# Patient Record
Sex: Female | Born: 1975 | Race: Black or African American | Hispanic: No | Marital: Single | Smoking: Former smoker
Health system: Southern US, Community
[De-identification: ages and names within clinical notes are randomized; demographics above are authoritative.]

## PROBLEM LIST (undated history)

## (undated) DIAGNOSIS — F32A Depression, unspecified: Secondary | ICD-10-CM

## (undated) DIAGNOSIS — F319 Bipolar disorder, unspecified: Secondary | ICD-10-CM

## (undated) DIAGNOSIS — F329 Major depressive disorder, single episode, unspecified: Secondary | ICD-10-CM

## (undated) DIAGNOSIS — Z8719 Personal history of other diseases of the digestive system: Secondary | ICD-10-CM

## (undated) DIAGNOSIS — K219 Gastro-esophageal reflux disease without esophagitis: Secondary | ICD-10-CM

## (undated) DIAGNOSIS — J45909 Unspecified asthma, uncomplicated: Secondary | ICD-10-CM

## (undated) DIAGNOSIS — R251 Tremor, unspecified: Secondary | ICD-10-CM

## (undated) HISTORY — DX: Gastro-esophageal reflux disease without esophagitis: K21.9

## (undated) HISTORY — PX: NO PAST SURGERIES: SHX2092

## (undated) HISTORY — DX: Unspecified asthma, uncomplicated: J45.909

---

## 2005-05-04 ENCOUNTER — Inpatient Hospital Stay: Payer: Self-pay

## 2005-09-28 ENCOUNTER — Ambulatory Visit: Payer: Self-pay | Admitting: Family Medicine

## 2005-10-24 ENCOUNTER — Ambulatory Visit: Payer: Self-pay | Admitting: Family Medicine

## 2006-05-11 ENCOUNTER — Ambulatory Visit: Payer: Self-pay | Admitting: Family Medicine

## 2006-10-31 ENCOUNTER — Encounter: Payer: Self-pay | Admitting: Family Medicine

## 2006-10-31 DIAGNOSIS — F411 Generalized anxiety disorder: Secondary | ICD-10-CM | POA: Insufficient documentation

## 2006-10-31 DIAGNOSIS — F329 Major depressive disorder, single episode, unspecified: Secondary | ICD-10-CM

## 2006-10-31 DIAGNOSIS — F3289 Other specified depressive episodes: Secondary | ICD-10-CM | POA: Insufficient documentation

## 2008-03-11 ENCOUNTER — Ambulatory Visit: Payer: Self-pay | Admitting: *Deleted

## 2008-03-11 ENCOUNTER — Emergency Department (HOSPITAL_COMMUNITY): Admission: EM | Admit: 2008-03-11 | Discharge: 2008-03-12 | Payer: Self-pay | Admitting: Emergency Medicine

## 2008-03-12 ENCOUNTER — Inpatient Hospital Stay (HOSPITAL_COMMUNITY): Admission: RE | Admit: 2008-03-12 | Discharge: 2008-03-13 | Payer: Self-pay | Admitting: *Deleted

## 2008-10-06 ENCOUNTER — Ambulatory Visit: Payer: Self-pay | Admitting: Unknown Physician Specialty

## 2009-07-13 ENCOUNTER — Ambulatory Visit (HOSPITAL_COMMUNITY): Admission: RE | Admit: 2009-07-13 | Discharge: 2009-07-13 | Payer: Self-pay | Admitting: Psychiatry

## 2009-07-20 ENCOUNTER — Encounter (INDEPENDENT_AMBULATORY_CARE_PROVIDER_SITE_OTHER): Payer: Self-pay | Admitting: Internal Medicine

## 2009-07-20 ENCOUNTER — Other Ambulatory Visit (HOSPITAL_COMMUNITY): Admission: RE | Admit: 2009-07-20 | Discharge: 2009-08-04 | Payer: Self-pay | Admitting: Psychiatry

## 2009-07-22 ENCOUNTER — Ambulatory Visit: Payer: Self-pay | Admitting: Psychiatry

## 2010-07-26 NOTE — Discharge Summary (Signed)
Meredith Cochran, Meredith Cochran                 ACCOUNT NO.:  [id number]   MEDICAL RECORD NO.:  [id number]          PATIENT TYPE:  IPS   LOCATION:  0306                          FACILITY:  [facility]   PHYSICIAN:  Jasmine Pang, M.D. DATE OF BIRTH:  November 18, 1975   DATE OF ADMISSION:  03/12/2008  DATE OF DISCHARGE:  03/13/2008                               DISCHARGE SUMMARY   IDENTIFICATION:  This is a 35 year old African American female who was  admitted on a voluntary basis on March 11, 2008.   HISTORY OF PRESENT ILLNESS:  The patient presents with a history of  overdosing on Klonopin.  It was her own medication.  She was having some  conflict with her boyfriend, who she has been a relationship for 4  years.  She states she was in a bad mood after the conversation.  She  also had a blow up with her boss reporting that just little things make  her mad.  She began to take more of her Klonopin, talked to her friend  and her boyfriend about taking Klonopin.  She then called her therapist  who advised her to go to the emergency room to get some help.  She was  having some suicidal thoughts.  She reports sleeping satisfactorily at  home.  Also, her appetite has been satisfactory.   PAST PSYCHIATRIC HISTORY:  This is the first admission to the [facility].  She has had no other hospitalizations.  She sees Dr.  Milagros Evener, she has been seeing her since 2006.  She is treating her  for anxiety and depression.  The patient has a therapist named, Vernell Leep.  In the past, the patient has been on Lexapro, Xanax, and  Paxil.  Paxil was causing excessive yawning.   FAMILY HISTORY:  Sister and mother both have depression.   ALCOHOL AND DRUG HISTORY:  The patient states that she drinks on  occasion.  Urine drug screen was positive for THC.  She denies any other  drug use.   MEDICAL PROBLEMS:  Asthma.   MEDICATIONS:  She has been on Pristiq 100 mg daily, Wellbutrin 300 mg  daily, Klonopin 1  mg q.i.d. p.r.n.   DRUG ALLERGIES:  No known drug allergies.   PHYSICAL EXAM:  There were no acute physical or medical problems.   LABORATORY DATA:  Shows a CBC within normal limits.  Urine drug screen  was positive for benzodiazepines, positive for THC.  Urine pregnancy  test was negative.  Alcohol level was less than 5.  Acetaminophen level  less than 10.  Urinalysis was negative.   HOSPITAL COURSE:  Upon admission, the patient was placed on Pristiq 100  mg p.o. q.day, Wellbutrin XL 300 mg q.day, and an albuterol inhaler 2  puffs q.4 h. p.r.n. shortness of breath.  She was also restarted on her  Klonopin 1 mg p.o. q.i.d. p.r.n. and Abilify 5 mg p.o. q.h.s.  In  individual sessions with me, the patient was friendly and cooperative.  She states she has been having problems with her boyfriend, who was the  father of her child when she got to work.  On the day of admission, she  felt very bad (anxious and depressed).  She has been having anger  problems.  Her friend got her to the ED and they transferred to [facility].  On March 13, 2008, sleep was good.  Appetite  was good.  Mood was less depressed, less anxious.  Affect was consistent  with mood.  There was no suicidal or homicidal ideation.  No thoughts of  self-injurious behavior.  No auditory or visual hallucinations.  No  paranoia or delusions.  Thoughts were logical and goal-directed.  Thought content no predominant theme.  Cognitive was grossly intact.  Insight good.  Judgment good.  Impulse control good.  The patient wanted  to go home and was felt to be safe for discharge.  Her boyfriend was  going to pick her up today.  She felt she would tolerate the Abilify  well and felt that it already was helping her some.   DISCHARGE DIAGNOSES:  Axis I:  Bipolar disorder, not otherwise  specified, tetrahydrocannabinol abuse.  Axis II:  None.  Axis III:  Asthma.  Axis IV:  Problems with her boyfriend and other  psychosocial problems.  Axis V:  Global assessment of functioning was 50 on discharge.  GAF was  35 to 40 upon admission.  GAF highest past year was 60.   DISCHARGE PLANS:  There was no specific activity level or dietary  restrictions.   POSTHOSPITAL CARE PLANS:  The patient will see Dr. Evelene Croon on January 30th  at 4 p.m.  She will see Vernell Leep, her counselor for therapy.   DISCHARGE MEDICATIONS:  1. Wellbutrin XL 300 mg daily.  2. Abilify 5 mg at bedtime.  3. Pristiq 100 mg daily.  4. Klonopin 1 mg up to four times a day as needed.      Jasmine Pang, M.D.  Electronically Signed     BHS/MEDQ  D:  03/13/2008  T:  03/14/2008  Job:  [id number]

## 2010-07-26 NOTE — H&P (Signed)
NAMEVIVAN, Meredith Cochran                 ACCOUNT NO.:  [id number]   MEDICAL RECORD NO.:  [id number]          PATIENT TYPE:  IPS   LOCATION:  0306                          FACILITY:  [facility]   PHYSICIAN:  Jasmine Pang, M.D. DATE OF BIRTH:  1975-09-07   DATE OF ADMISSION:  03/12/2008  DATE OF DISCHARGE:                       PSYCHIATRIC ADMISSION ASSESSMENT   I am dictating on a 35 year old female voluntarily March 11, 2008.   HISTORY OF PRESENT ILLNESS:  Patient presents with a history of  overdosing on Klonopin.  It was her own medication.  She was having some  conflict with her boyfriend who she has been in a relationship for 4  years.  States she was in a bad mood after the conversation.  Also, had  had a blow up with her boss reporting that just little things make her  mad.  She began to take more of her Klonopin, texted her friend and her  boyfriend about taking the Klonopin, had then called her therapist who  advised her to go to the emergency room to get some help.  She was  having suicidal thoughts.  She reports sleeping satisfactorily at home.  Also, her appetite has been satisfactory.   PAST PSYCHIATRIC HISTORY:  First admission to the [facility].  No other hospitalizations.  Sees Dr. Milagros Evener, has been  seeing her since 2006 who is treating her anxiety and depression.  Patient has a therapist named Lowella Fairy.  In the past, patient has been  on Lexapro and Xanax and Paxil, Paxil caused excessive yawning.   SOCIAL HISTORY:  A 35 year old female.  She has a son.  She works at  [employer] and has been for the past 5 years and is currently in a  relationship.   FAMILY HISTORY:  Sister and mother both with depression.   ALCOHOL AND DRUG HISTORY:  Patient does state she drinks on occasion.  Urine drug screen is positive for THC.  Denies any other drug use.   PRIMARY CARE Marge Vandermeulen:  Unknown.   MEDICAL PROBLEMS:  Asthma.   MEDICATIONS:  Has been on:  1.  Pristiq 100 mg daily.  2. Wellbutrin 300 mg daily.  3. Klonopin 1 mg q.i.d. p.r.n.   DRUG ALLERGIES:  NO KNOWN ALLERGIES.   PHYSICAL EXAM:  This is an overweight female fully assessed at [facility] Emergency Department.  Her physical exam was reviewed.  She  received no medications.  Her temperature is 97.4, 66 heart rate, 60  respirations, blood pressure is 114/64, 5 feet 7 inches tall, 247  pounds.   LABORATORY DATA:  Shows a CBC within normal limits.  Urine drug screen  is positive for benzodiazepines, positive for THC.  Urine pregnancy test  is negative.  Alcohol level less than 5.  Acetaminophen level less than  10.  Urinalysis is negative.   MENTAL STATUS EXAM:  She is fully alert, cooperative, somewhat  disheveled, good eye contact.  Speech is clear.  Although articulate,  she does ramble at times.  Her mood is neutral.  Patient's affect is  pleasant.  Some anxiety.  Her thought processes are coherent.  No  evidence of psychosis.  Denies any suicidal thoughts at this time.  Cognitive function intact.  Her memory is good.  Judgment and insight  appear to be good.   AXIS I:  1. Mood disorder.  2. THC abuse.  AXIS II:  Deferred.  AXIS III:  Asthma.  AXIS IV:  Problems with her boyfriend and other psychosocial problems.  AXIS V:  Current is 35 to 40.   PLAN:  Is to stabilize her mood and thinking.  We will clarify her  medications.  Patient gave permission to call Dr. Milagros Evener.  A  family session was offered with her support group.  We will continue  with her Pristiq and Wellbutrin and Klonopin after clarification,  reinforce medication compliance, and patient is to continue with her  therapy sessions.   TENTATIVE LENGTH OF STAY:  At this time, is 2 to 3 days.      Landry Corporal, N.P.      Jasmine Pang, M.D.  Electronically Signed    JO/MEDQ  D:  03/12/2008  T:  03/12/2008  Job:  [id number]

## 2010-07-29 NOTE — Assessment & Plan Note (Signed)
[facility]                             [facility] OFFICE NOTE   NAME:Pavlik, COREEN                          MRN:          [id number]  DATE:09/28/2005                            DOB:          26-May-1975    A 35 year old black female, new to the practice, here to establish.  Complains of dizzy spells lately.  Has had anxiety which has increased  lately.  Diagnosed with moderate to severe anxiety and depression, now  having issues again.  She has been on Klonopin, Xanax and Paxil in the past.  Felt that she had side-effect of yawning from one of those, presumably  Paxil.  Also has facial hair, and requests Vaniqa.  She has significant  problems with talking on the phone.  Her heart starts pounding, hands will  shake, she gets dizziness, shortness of breath, and the smallest thing makes  her angry.   PAST MEDICAL HISTORY:  Is breast-feeding at the current time, has had 2  vaginal deliveries.  Goes to OB/GYN at [facility].  Denies rheumatic fever or  scarlet fever, high blood pressure, heart disease, pneumonia, diabetes,  liver disease or thyroid disease.  Has had asthma, uses albuterol rarely,  maybe 2 or 3 times a year, mostly in the winter.   ALLERGIES:  NO KNOWN DRUG ALLERGIES.   Does not know her cholesterol.  Smokes 5 or 6 cigarettes a day, has done so  during her pregnancy, since 35 years of age.  Quit in 2006 for a year, but  is back smoking.  Drinks occasionally.  Does not use drugs.   MEDICATIONS:  Allegra 180 mg a day, and Flonase as needed, neither of which  she takes regularly.  She has also been diagnosed yesterday with a dental  abscess, is on penicillin and Vicodin.   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  Is single.  Has a  significant other, with whom she has been monogamous for the last 2 years.  She has 2 children; a female 21 years of age, from a different father; and a  female 46 months of age, with her current  significant other.   FAMILY HISTORY:  Father is alive at age of 16, mother is alive at age of 1.  One brother at 21 and one sister at 56.  Paternal grandmother, uncle and  aunt all have high blood pressure.  Paternal uncle, grandmother, aunt and  maternal grandmother all had diabetes.  Paternal uncle has had prostate  cancer.  Paternal aunt x2 has had breast cancer.  No other cancer, alcohol  or drug abuse, or strokes in the family.   Unknown when her last tetanus shot was.   PHYSICAL EXAMINATION:  This is a well-developed, well-nourished, 35 year old  black female, mildly obese, in no acute distress.  Temperature of 98.0, pulse of 80, blood pressure of 120/60, a weight of 210,  height of 67 inches.  HEENT:  Within normal limits.  NECK:  Without adenopathy.  Thyroid is without nodularity.  LUNGS:  Clear to auscultation.  BACK:  Straight, nontender, with no CVA tenderness.  HEART:  Regular rate and rhythm without murmur.  PSYCHOLOGIC:  Good affect, good insight and judgment is normal.   ASSESSMENT:  Anxiety, possibly depression, in patient who is breast-feeding.   PLAN:  We discussed the tradeoff of treating the anxiety and stopping her  breast-feeding, versus breast-feeding and dealing with her anxiety with  conservative measures, leave time for herself, be off work for 1 month via  [employer].  An excuse was written, she will need to bring in the paperwork.  Exercise regularly, talk with people as much as possible.  If it does not  work, we will put her on Zoloft, but she will need to stop breast-feeding.  The risks of Xanax and SSRIs with breast-feeding were discussed.  We will  give her a tetanus shot in the future.  Return here as needed.                                   Arta Silence, MD   RNS/MedQ  DD:  09/29/2005  DT:  09/30/2005  Job #:  [id number]

## 2010-12-16 LAB — URINALYSIS, ROUTINE W REFLEX MICROSCOPIC
Hgb urine dipstick: NEGATIVE
Nitrite: NEGATIVE
Protein, ur: NEGATIVE mg/dL
Specific Gravity, Urine: 1.01 (ref 1.005–1.030)
Urobilinogen, UA: 1 mg/dL (ref 0.0–1.0)

## 2010-12-16 LAB — ETHANOL: Alcohol, Ethyl (B): <5 mg/dL (ref 0–10)

## 2010-12-16 LAB — RAPID URINE DRUG SCREEN, HOSP PERFORMED
Amphetamines: NOT DETECTED
Barbiturates: NOT DETECTED
Benzodiazepines: POSITIVE — AB
Tetrahydrocannabinol: POSITIVE — AB

## 2010-12-16 LAB — DIFFERENTIAL
Lymphocytes Relative: 26 % (ref 12–46)
Monocytes Absolute: 0.5 10*3/uL (ref 0.1–1.0)
Monocytes Relative: 5 % (ref 3–12)
Neutro Abs: 6 10*3/uL (ref 1.7–7.7)

## 2010-12-16 LAB — CBC
Hemoglobin: 12.7 g/dL (ref 12.0–15.0)
RBC: 4.32 MIL/uL (ref 3.87–5.11)

## 2010-12-16 LAB — POCT PREGNANCY, URINE: Preg Test, Ur: NEGATIVE

## 2013-12-04 ENCOUNTER — Telehealth (INDEPENDENT_AMBULATORY_CARE_PROVIDER_SITE_OTHER): Payer: Self-pay

## 2013-12-04 DIAGNOSIS — F4323 Adjustment disorder with mixed anxiety and depressed mood: Secondary | ICD-10-CM

## 2013-12-04 NOTE — Telephone Encounter (Signed)
Pt seen in office today by Dr Andrey Campanile. Initial appt for sleeve and orders placed in epic. Nutrition and Psch consults were placed thru allscripts.

## 2013-12-09 ENCOUNTER — Ambulatory Visit (HOSPITAL_COMMUNITY): Payer: Self-pay

## 2013-12-09 ENCOUNTER — Ambulatory Visit (HOSPITAL_COMMUNITY): Admission: RE | Admit: 2013-12-09 | Payer: Self-pay | Source: Ambulatory Visit

## 2013-12-10 ENCOUNTER — Ambulatory Visit (HOSPITAL_COMMUNITY)
Admission: RE | Admit: 2013-12-10 | Discharge: 2013-12-10 | Disposition: A | Discharge status: Home / Self Care | Payer: BC Managed Care – PPO | Source: Ambulatory Visit | Attending: General Surgery | Admitting: General Surgery

## 2013-12-10 DIAGNOSIS — F4323 Adjustment disorder with mixed anxiety and depressed mood: Secondary | ICD-10-CM | POA: Diagnosis not present

## 2013-12-10 DIAGNOSIS — K449 Diaphragmatic hernia without obstruction or gangrene: Secondary | ICD-10-CM | POA: Diagnosis not present

## 2013-12-10 DIAGNOSIS — K219 Gastro-esophageal reflux disease without esophagitis: Secondary | ICD-10-CM | POA: Diagnosis not present

## 2013-12-10 DIAGNOSIS — Z01818 Encounter for other preprocedural examination: Secondary | ICD-10-CM | POA: Diagnosis not present

## 2014-01-06 ENCOUNTER — Encounter: Payer: BC Managed Care – PPO | Attending: General Surgery | Admitting: Dietician

## 2014-01-06 ENCOUNTER — Encounter: Payer: Self-pay | Admitting: Dietician

## 2014-01-06 ENCOUNTER — Other Ambulatory Visit (INDEPENDENT_AMBULATORY_CARE_PROVIDER_SITE_OTHER): Payer: Self-pay

## 2014-01-06 DIAGNOSIS — Z713 Dietary counseling and surveillance: Secondary | ICD-10-CM | POA: Insufficient documentation

## 2014-01-06 DIAGNOSIS — Z6841 Body Mass Index (BMI) 40.0 and over, adult: Secondary | ICD-10-CM | POA: Diagnosis not present

## 2014-01-06 NOTE — Patient Instructions (Signed)
Follow Pre-Op Goals Try Protein Shakes Call [facility] at [phone] when surgery is scheduled to enroll in Pre-Op Class 

## 2014-01-06 NOTE — Progress Notes (Signed)
  Pre-Op Assessment Visit:  Pre-Operative Sleeve Gastrectomy Surgery  Medical Nutrition Therapy:  Appt start time: 0830   End time:  0915.  Patient was seen on 01/06/2014 for Pre-Operative Sleeve Gastrectomy Nutrition Assessment. [facility] on 01/06/2014.   Preferred Learning Style:   No preference indicated   Learning Readiness:   Ready  Handouts given during visit include:  Pre-Op Goals Bariatric Surgery Protein Shakes  Teaching Method Utilized:  Visual Auditory Hands on  Barriers to learning/adherence to lifestyle change: none  Demonstrated degree of understanding via:  Teach Back   Patient to call the [facility] to enroll in Pre-Op and Post-Op Nutrition Education when surgery date is scheduled.

## 2014-01-21 ENCOUNTER — Encounter: Payer: BC Managed Care – PPO | Attending: General Surgery | Admitting: Dietician

## 2014-01-21 VITALS — Wt 295.0 lb

## 2014-01-21 DIAGNOSIS — Z6841 Body Mass Index (BMI) 40.0 and over, adult: Secondary | ICD-10-CM | POA: Diagnosis not present

## 2014-01-21 DIAGNOSIS — E669 Obesity, unspecified: Secondary | ICD-10-CM

## 2014-01-21 DIAGNOSIS — Z713 Dietary counseling and surveillance: Secondary | ICD-10-CM | POA: Diagnosis not present

## 2014-01-21 NOTE — Progress Notes (Signed)
  Supervised weight loss:  Appt start time: 0950 end time:  1005.  SWL visit #1:  Primary concerns today:  Meredith Cochran returns today for her 1st SWL visit out of 3 in preparation for sleeve gastrectomy. She has lost 4 pounds since her assessment a few weeks ago. She reports cutting out coffee, as she used a lot of sweetened creamer. Also tried protein shakes and likes Premier. She has been working on cutting out refined carbs altogether. Working on Passenger transport managerbuying healthy foods when grocery shopping.  Has also been looking at Bariatric Eating website and finding recipes. Working on chewing food thoroughly.  Wt Readings from Last 3 Encounters:  01/21/14 295 lb (133.811 kg)  01/06/14 299 lb 1.6 oz (135.671 kg)   Ht Readings from Last 3 Encounters:  01/06/14 5\' 8"  (1.727 m)  09/20/05 5\' 7"  (1.702 m)   Body mass index is 44.86 kg/(m^2). @BMIFA @ Normalized weight-for-age data available only for age 24 to 20 years. Normalized stature-for-age data available only for age 24 to 20 years.   MEDICATIONS: see list  DIETARY INTAKE:  Having boiled eggs for breakfast, 2 protein shakes per day.   Estimated energy needs: 1600-1800 calories  Progress Towards Goal(s):  In progress.   Nutritional Diagnosis:  [place]-3.3 Overweight/obesity related to past poor dietary habits and physical inactivity as evidenced by patient in SWL for pending gastric sleeve surgery following dietary guidelines for continued weight loss.     Intervention:  Nutrition counseling provided.   Monitoring/Evaluation:  Dietary intake, exercise, and body weight in 4 week(s).

## 2014-01-21 NOTE — Patient Instructions (Signed)
Continue practicing pre op goals.

## 2014-01-27 ENCOUNTER — Ambulatory Visit: Payer: BC Managed Care – PPO | Admitting: Dietician

## 2014-02-10 ENCOUNTER — Ambulatory Visit: Payer: BC Managed Care – PPO | Admitting: Dietician

## 2014-02-18 ENCOUNTER — Encounter: Payer: BC Managed Care – PPO | Attending: General Surgery | Admitting: Dietician

## 2014-02-18 VITALS — Ht 68.0 in | Wt 283.7 lb

## 2014-02-18 DIAGNOSIS — Z6841 Body Mass Index (BMI) 40.0 and over, adult: Secondary | ICD-10-CM | POA: Diagnosis not present

## 2014-02-18 DIAGNOSIS — Z713 Dietary counseling and surveillance: Secondary | ICD-10-CM | POA: Insufficient documentation

## 2014-02-18 DIAGNOSIS — E669 Obesity, unspecified: Secondary | ICD-10-CM

## 2014-02-18 NOTE — Progress Notes (Signed)
  Supervised weight loss:  Appt start time: 0955 end time:  1010.  SWL visit #2:  Primary concerns today:  Safire returns today for her 2nd SWL visit out of 3 in preparation for sleeve gastrectomy. She has lost 12 pounds since last month!  Overall things have been going well. Those has had some carbs (popcorn, chips, pop tarts). Overall having a lot of protein. Having cheese in moderation. Eating vegetables and beans.   Would like to start exercise and is moving this week.   Wt Readings from Last 3 Encounters:  02/18/14 283 lb 11.2 oz (128.685 kg)  01/21/14 295 lb (133.811 kg)  01/06/14 299 lb 1.6 oz (135.671 kg)   Ht Readings from Last 3 Encounters:  02/18/14 5\' 8"  (1.727 m)  01/06/14 5\' 8"  (1.727 m)  09/20/05 5\' 7"  (1.702 m)   Body mass index is 43.15 kg/(m^2). @BMIFA @ Normalized weight-for-age data available only for age 51 to 20 years. Normalized stature-for-age data available only for age 51 to 20 years.   MEDICATIONS: see list  DIETARY INTAKE:  Having boiled eggs for breakfast, 2 protein shakes per day.   Estimated energy needs: 1600-1800 calories  Progress Towards Goal(s):  In progress.   Nutritional Diagnosis:  [place]-3.3 Overweight/obesity related to past poor dietary habits and physical inactivity as evidenced by patient in SWL for pending gastric sleeve surgery following dietary guidelines for continued weight loss.     Intervention:  Nutrition counseling provided. Plan: Continue working on C.H. Robinson WorldwidePre-Op goals. Plan to walk 15-20 minutes 3 x week. Try to get out in the middle of the day.  Work on increasing fluid intake (hot tea or water).   Monitoring/Evaluation:  Dietary intake, exercise, and body weight in 4 week(s).

## 2014-02-18 NOTE — Patient Instructions (Addendum)
Continue working on C.H. Robinson WorldwidePre-Op goals. Plan to walk 15-20 minutes 3 x week. Try to get out in the middle of the day.  Work on increasing fluid intake (hot tea or water).

## 2014-03-17 ENCOUNTER — Ambulatory Visit: Payer: BC Managed Care – PPO | Admitting: Dietician

## 2014-03-19 ENCOUNTER — Encounter: Payer: 59 | Attending: General Surgery | Admitting: Dietician

## 2014-03-19 DIAGNOSIS — Z713 Dietary counseling and surveillance: Secondary | ICD-10-CM | POA: Diagnosis not present

## 2014-03-19 DIAGNOSIS — Z6841 Body Mass Index (BMI) 40.0 and over, adult: Secondary | ICD-10-CM | POA: Insufficient documentation

## 2014-03-19 NOTE — Progress Notes (Signed)
  Supervised weight loss:  Appt start time: 830 end time:  845.  SWL visit #3:  Primary concerns today:  Meredith Cochran returns today for her 3rd SWL visit out of 3 in preparation for sleeve gastrectomy. She has lost 6 pounds since last month.   Has been limiting exercise. Starting exercise but stopped while on vacation. Plans to start walking and doing inside exercise. Has been increasing her fluid intake.    Wt Readings from Last 3 Encounters:  03/19/14 277 lb (125.646 kg)  02/18/14 283 lb 11.2 oz (128.685 kg)  01/21/14 295 lb (133.811 kg)   Ht Readings from Last 3 Encounters:  03/19/14 5\' 8"  (1.727 m)  02/18/14 5\' 8"  (1.727 m)  01/06/14 5\' 8"  (1.727 m)   Body mass index is 42.13 kg/(m^2). @BMIFA @ Normalized weight-for-age data available only for age 68 to 20 years. Normalized stature-for-age data available only for age 68 to 20 years.   MEDICATIONS: see list  DIETARY INTAKE:  Having boiled eggs for breakfast, 2 protein shakes per day.   Estimated energy needs: 1600-1800 calories  Progress Towards Goal(s):  In progress.   Nutritional Diagnosis:  [place]-3.3 Overweight/obesity related to past poor dietary habits and physical inactivity as evidenced by patient in SWL for pending gastric sleeve surgery following dietary guidelines for continued weight loss.     Intervention:  Nutrition counseling provided. Plan: Continue working on C.H. Robinson WorldwidePre-Op goals. Plan to walk 15-20 minutes 3 x week. Try to get out in the middle of the day.  Work on increasing fluid intake (hot tea or water).   Monitoring/Evaluation:  Dietary intake, exercise, and body weight. Follow up to attend Pre-Op Class

## 2014-03-19 NOTE — Patient Instructions (Signed)
Continue working on Pre-Op goals. Plan to walk 15-20 minutes 3 x week. Try to get out in the middle of the day.  Work on increasing fluid intake (hot tea or water).  

## 2014-04-14 ENCOUNTER — Ambulatory Visit: Payer: BC Managed Care – PPO | Admitting: Dietician

## 2014-04-17 ENCOUNTER — Ambulatory Visit (INDEPENDENT_AMBULATORY_CARE_PROVIDER_SITE_OTHER): Payer: Self-pay | Admitting: General Surgery

## 2014-04-27 ENCOUNTER — Encounter: Payer: 59 | Attending: General Surgery

## 2014-04-27 DIAGNOSIS — Z6841 Body Mass Index (BMI) 40.0 and over, adult: Secondary | ICD-10-CM | POA: Insufficient documentation

## 2014-04-27 DIAGNOSIS — Z713 Dietary counseling and surveillance: Secondary | ICD-10-CM | POA: Insufficient documentation

## 2014-04-28 NOTE — Progress Notes (Signed)
  Pre-Operative Nutrition Class:  Appt start time: 830   End time:  930.  Patient was seen on 04/27/14 for Pre-Operative Bariatric Surgery Education at [facility].   Surgery date: 05/18/14 Surgery type: Gastric sleeve [facility] 299 lbs on 01/06/14 Weight today: 266.5 lbs  TANITA  BODY COMP RESULTS  04/27/14   BMI (kg/m^2) 40.5   Fat Mass (lbs) 134   Fat Free Mass (lbs) 132.5   Total Body Water (lbs) 97   Samples given per MNT protocol. Patient educated on appropriate usage: Premier protein shake (strawberry - qty 1) Lot #: [id number] Exp: 10/2014  Unjury protein powder (chicken soup - qty 1) Lot #: [id number] Exp: 05/2014  PB2 (qty 1) Lot #: [id number] Exp: 10/2014  Bariatric Advantage Calcium citrate chew (orange - qty 1)  Lot #: [id number] Exp: 07/2014  The following the learning objectives were met by the patient during this course:  Identify Pre-Op Dietary Goals and will begin 2 weeks pre-operatively  Identify appropriate sources of fluids and proteins   State protein recommendations and appropriate sources pre and post-operatively  Identify Post-Operative Dietary Goals and will follow for 2 weeks post-operatively  Identify appropriate multivitamin and calcium sources  Describe the need for physical activity post-operatively and will follow MD recommendations  State when to call healthcare provider regarding medication questions or post-operative complications  Handouts given during class include:  Pre-Op Bariatric Surgery Diet Handout  Protein Shake Handout  Post-Op Bariatric Surgery Nutrition Handout  BELT Program Information Flyer  Support Group Information Flyer  [facility] Bariatric Supplements Price List  Follow-Up Plan: Patient will follow-up at [facility] 2 weeks post operatively for diet advancement per MD.

## 2014-05-04 ENCOUNTER — Telehealth: Payer: Self-pay | Admitting: Dietician

## 2014-05-04 NOTE — Telephone Encounter (Signed)
[facility] which protein shakes would be appropriate pre and post bariatric surgery and adding a calcium to her bariatric multivitamins.

## 2014-05-07 ENCOUNTER — Ambulatory Visit (INDEPENDENT_AMBULATORY_CARE_PROVIDER_SITE_OTHER): Payer: Self-pay | Admitting: General Surgery

## 2014-05-07 NOTE — H&P (Signed)
Meredith Cochran 05/07/2014 9:29 AM Location: [facility] Patient #: [id number] DOB: 12/11/1975 Single / Language: [name] / Race: Black or African American Female History of Present Illness Meredith Cochran. Meredith Cope MD; 05/07/2014 10:01 AM) The patient is a 38 year old female who presents for a pre-op visit. She is currently scheduled for laparoscopic sleeve gastrectomy with hiatal hernia repair with upper endoscopy on March 7. I initially met her in late September 2015. Her weight at that time was 294 pounds. She denies any significant medical changes since I initially met her. She denies any trips to the emergency room or hospital. She denies any new medical diagnoses. She has been following the nutritionist for plan and has lost around 30 pounds. She is taking supplemental vitamin D for vitamin D deficiency. She is a Restaurant manager, fast food. She reports some ongoing reflux issues as well as intermittent constipation. She denies any chest pain, chest pressure, shortness of breath, paroxysmal nocturnal dyspnea, TIAs or amaurosis fugax. She denies any melena or hematochezia. She reports normal urination. She is still seeing her psychiatrist on a regular basis. Her chest x-ray was within normal limits. Her upper GI showed a moderate size hiatal hernia. Her initial creatinine evaluation labs were within normal limits except for vitamin D deficiency. Her creatinine was around 1.1. There were no signs of anemia. 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.46  M25.561, M25.562) OBESITY, MORBID, BMI 40.0-49.9 (278.01  E66.01) HIATAL HERNIA (553.3  K44.9) BIPOLAR AFFECTIVE DISORDER, REMISSION STATUS UNSPECIFIED (296.80  F31.9) VITAMIN D DEFICIENCY (268.9  E55.9) 28.6 on 09/10/13 Anxiety  Disorder Depression  Past Surgical History Meredith Curry, MD; 05/07/2014 10:01 AM) No pertinent past surgical history  Diagnostic Studies History Meredith Curry, MD; 05/07/2014 10:01 AM) Pap Smear 1-5 years ago Colonoscopy never Mammogram never  Allergies Meredith Curry, MD; 05/07/2014 10:01 AM) No Known Drug Allergies 12/04/2013  Medication History Meredith Curry, MD; 05/07/2014 10:01 AM) Vitamin D (Ergocalciferol) (50000UNIT Capsule, Oral) Active. LamoTRIgine (200MG Tablet, 2 Oral daily) Active. Saphris (10MG Tab Sublingual, Sublingual) Active. Adderall XR (15MG Capsule ER 24HR, Oral) Active. Multivitamins (Oral) Active. KlonoPIN (1MG Tablet, Oral) Active. Advil (200MG Capsule, Oral) Active. Protonix (40MG Packet, Oral) Active. Vitamin D (50000U Tablet, Oral) Active. Medications Reconciled OxyCODONE HCl (5MG/5ML Solution, 5-10 Milliliter Oral every four hours, as needed, Taken starting 05/07/2014) Active.  Social History Meredith Curry, MD; 06/26/6061 01:60 AM) Illicit drug use Remotely quit drug use. Tobacco use Former smoker. Caffeine use Coffee. Alcohol use Moderate alcohol use. HAS A GLASS OF WINE ABOUT 4 NIGHTS A WEEK  Family History Meredith Curry, MD; 05/07/2014 10:01 AM) Breast Cancer Family Members In General. Cancer Family Members In General. Thyroid problems Family Members In General. Respiratory Condition Family Members In General. Diabetes Mellitus Family Members In General. Hypertension Family Members In General. Migraine Headache Mother.  Pregnancy / Birth History Meredith Curry, MD; 05/07/2014 10:01 AM) Meredith Cochran 3 Maternal Cochran 84-25 Para 2 Contraceptive History Contraceptive implant, Depo-provera, Intrauterine device. Cochran at menarche 71 years. Regular periods     Review of Systems Meredith Cochran. Meredith Eckman MD; 05/07/2014 9:58 AM) General Present- Weight Gain. Not Present- Appetite Loss, Chills, Fatigue, Fever, Night Sweats and  Weight Loss. Skin Present- Dryness. Not Present- Change in Wart/Mole, Hives, Jaundice, New Lesions, Non-Healing Wounds, Rash and Ulcer. HEENT  Not Present- Earache, Hearing Loss, Hoarseness, Nose Bleed, Oral Ulcers, Ringing in the Ears, Seasonal Allergies, Sinus Pain, Sore Throat, Visual Disturbances, Wears glasses/contact lenses and Yellow Eyes. Respiratory Not Present- Bloody sputum, Chronic Cough, Difficulty Breathing, Snoring and Wheezing. Cardiovascular Present- Difficulty Breathing On Exertion (with stairs) and Swelling of Extremities. Not Present- Chest Pain, Difficulty Breathing Lying Down, Fainting, Fainting / Blacking Out, Leg Cramps, Orthopnea, Palpitations, Paroxysmal Nocturnal Dyspnea, Rapid Heart Rate and Shortness of Breath. Gastrointestinal Present- Excessive gas, Heartburn (USES PROTONIX daily; REFLUX OCCURS RANDOMLY) and Indigestion. Not Present- Abdominal Pain, Bloating, Bloody Stool, Change in Bowel Habits, Chronic diarrhea, Constipation, Difficulty Swallowing, Gets full quickly at meals, Hemorrhoids, Nausea, Rectal Pain and Vomiting. Female Genitourinary Present- Nocturia and Urgency. Not Present- Absence of Menstruation, Amenorrhea, Dysmenorrhea, Excessive Menstrual Bleeding, Excessive Non-Menstrual Bleeding, Frequency, Painful Urination and Pelvic Pain. Musculoskeletal Present- Joint Pain (BILATERAL KNEE PAIN. LIMITS WALKING) and Joint Stiffness. Not Present- Back Pain, Muscle Pain, Muscle Weakness and Swelling of Extremities. Neurological Present- Tingling and Tremor. Not Present- Decreased Memory, Fainting, Headaches, Numbness, Seizures, Trouble walking and Weakness. Psychiatric Present- Bipolar (NO MAJOR EPISODES - SEES DR Meredith Cochran Meredith Cochran [place] Meredith Cochran). Not Present- Anxiety, Change in Sleep Pattern, Depression, Fearful and Frequent crying. Endocrine Not Present- Cold Intolerance, Excessive Hunger, Hair Changes, Heat Intolerance, Hot flashes and New Diabetes. Hematology Not Present- Easy  Bruising, Excessive bleeding, Gland problems, HIV and Persistent Infections.  Vitals (Meredith Spillers MA; 05/07/2014 9:30 AM) 05/07/2014 9:29 AM Weight: 264.8 lb Height: 68in Body Surface Area: 2.4 Cochran Body Mass Index: 40.26 kg/Cochran Temp.: 97.53F(Oral)  Pulse: 86 (Regular)  BP: 142/82 (Sitting, Left Arm, Standard)     Physical Exam Meredith Cochran. Glady Ouderkirk MD; 05/07/2014 9:57 AM)  General Mental Status-Alert. General Appearance-Consistent with stated Cochran. Hydration-Well hydrated. Voice-Normal. Note: morbidly obese, evenly distributed   Head and Neck Head-normocephalic, atraumatic with no lesions or palpable masses. Trachea-midline. Thyroid Gland Characteristics - normal size and consistency.  Eye Eyeball - Bilateral-Extraocular movements intact. Sclera/Conjunctiva - Bilateral-No scleral icterus.  Chest and Lung Exam Chest and lung exam reveals -quiet, even and easy respiratory effort with no use of accessory muscles, normal resonance, no flatness or dullness and on auscultation, normal breath sounds, no adventitious sounds and normal vocal resonance. Inspection Chest Wall - Normal. Back - normal.  Breast - Did not examine.  Abdomen Inspection Inspection of the abdomen reveals - No Hernias. Skin - Scar - no surgical scars. Palpation/Percussion Palpation and Percussion of the abdomen reveal - Soft, Non Tender, No Rebound tenderness, No Rigidity (guarding) and No hepatosplenomegaly. Auscultation Auscultation of the abdomen reveals - Bowel sounds normal.  Rectal - Did not examine.  Peripheral Vascular Upper Extremity Inspection - Bilateral - Normal - No Clubbing, No Cyanosis, No Edema, Pulses Intact. Palpation - Pulses bilaterally normal. Lower Extremity Palpation - Pulses bilaterally normal.  Neurologic Neurologic evaluation reveals -alert and oriented x 3 with no impairment of recent or remote memory. Mental  Status-Normal. Sensory-Normal. Gait-Normal.  Musculoskeletal Normal Exam - Left-Upper Extremity Strength Normal and Lower Extremity Strength Normal. Normal Exam - Right-Upper Extremity Strength Normal and Lower Extremity Strength Normal.  Lymphatic Head & Neck  General Head & Neck Lymphatics: Bilateral - Description - Normal. Axillary - Did not examine. Femoral & Inguinal  Generalized Femoral & Inguinal Lymphatics: Bilateral - Description - Normal. Tenderness - Non Tender.    Assessment & Plan Meredith Cochran. Justo Hengel MD; 05/07/2014 9:55 AM)  OBESITY, MORBID, BMI 40.0-49.9 (278.01  E66.01) Impression: We reviewed her preoperative workup to  date. There is no signs of anemia. 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 On her upper GI did show a moderate size hiatal hernia. We discussed this. I explained that we will test for hiatal hernia during surgery and if found to have a clinically significant hiatal hernia I would proceed with hiatal hernia repair in order to decrease postoperative complications. We discussed this with involved dissecting out the left and right crus of the diaphragm and suturing the muscle back together. We discussed the typical recovery process. I advised her to maintain her current weight and not to lose any additional weight. She was given her postoperative pain medicine prescription today  Current Plans Started OxyCODONE HCl 5MG/5ML, 5-10 Milliliter every four hours, as needed, 200 Milliliter, 05/07/2014, No Refill. Instructions: Congratulations on starting your journey to a healthier life! Over the next few weeks you will be undergoing tests (x-rays and labs) and seeing specialists to help evaluate you for weight loss surgery. These tests and consultations with a psychologist and nutritionist are needed to prepare you for the lifestyle changes that lie ahead and are often required by insurance companies to approve you for surgery. Please call  me if you have any questions during the evaluation.  Pathway to Surgery:  Two weeks prior to surgery Maintain your current body weight Attend preoperative appointment with your surgeon Attend preoperative surgery class  One week prior to surgery No aspirin products. Tylenol is acceptable  24 hours prior to surgery No alcoholic beverages Report fever greater than 100.5 or excessive nasal drainage suggesting infection Continue bariatric preop diet Perform bowel prep if ordered Do not eat or drink anything after midnight the night before surgery Do not take any medications except those instructed by the anesthesiologist  Morning of surgery Please arrive at the hospital at least 2 hours before your scheduled surgery time. No makeup, fingernail polish or jewelry Bring insurance cards with you Bring your CPAP mask if you use this ARTHRALGIA OF BOTH KNEES (719.46  M25.561)  GASTROESOPHAGEAL REFLUX DISEASE, ESOPHAGITIS PRESENCE NOT SPECIFIED (530.81  K21.9)  VITAMIN D DEFICIENCY (268.9  E55.9) Story: 28.6 on 09/10/13 Impression: Currently taking 50,000 international units once a week. Continue current therapy  BIPOLAR AFFECTIVE DISORDER, REMISSION STATUS UNSPECIFIED (296.80  F31.9) Impression: per psychiatrist  HIATAL HERNIA (553.3  K44.9) Impression: Her upper GI did show evidence of a moderate size hiatal hernia. see above-mentioned discussion  TRANSFUSION OF BLOOD PRODUCT REFUSED FOR RELIGIOUS REASON (V62.6  Z53.1)  Leighton Ruff. Redmond Pulling, MD, FACS General, Bariatric, & Minimally Invasive Surgery [facility], [place]

## 2014-05-11 NOTE — Patient Instructions (Addendum)
20 [address] PatientDeanna M Colden  05/11/2014   Your procedure is scheduled on:   05-18-2014 Monday  [facility]. Arrive at    0515  AM.  Call this number if you have problems the morning of surgery: [phone]  Or Presurgical Testing [phone].     Do not eat food/ or drink: After Midnight.SUNDAY NIGHT     Take these medicines the morning of surgery with A SIP OF WATER: Lamictal. Pantoprazole. Clonazepam.   Do not wear jewelry, make-up or nail polish.  Do not wear deodorant, lotions, powders, or perfumes.   Do not shave legs and under arms- 48 hours(2 days) prior to first CHG shower.(Shaving face and neck okay.)  Do not bring valuables to the hospital.(Hospital is not responsible for lost valuables).  Contacts, dentures or removable bridgework, body piercing, hair pins may not be worn into surgery.  Leave suitcase in the car. After surgery it may be brought to your room.  For patients admitted to the hospital, checkout time is 11:00 AM the day of discharge.(Restricted visitors-Any Persons displaying flu-like symptoms or illness).    Patients discharged the day of surgery will not be allowed to drive home. Must have responsible person with you x 24 hours once discharged.  Name and phone number of your driver:  mom    Please read over the following fact sheets that you were given:  CHG(Chlorhexidine Gluconate 4% Surgical Soap) use.           [facility] - Preparing for Surgery Before surgery, you can play an important role.  Because skin is not sterile, your skin needs to be as free of germs as possible.  You can reduce the number of germs on your skin by washing with CHG (chlorahexidine gluconate) soap before surgery.  CHG is an antiseptic cleaner which kills germs and bonds with the skin to continue killing germs even after washing. Please DO NOT use if you have an allergy to CHG or antibacterial soaps.  If your skin becomes  reddened/irritated stop using the CHG and inform your nurse when you arrive at Short Stay. Do not shave (including legs and underarms) for at least 48 hours prior to the first CHG shower.  You may shave your face/neck. Please follow these instructions carefully:  1.  Shower with CHG Soap the night before surgery and the  morning of Surgery.  2.  If you choose to wash your hair, wash your hair first as usual with your  normal  shampoo.  3.  After you shampoo, rinse your hair and body thoroughly to remove the  shampoo.                           4.  Use CHG as you would any other liquid soap.  You can apply chg directly  to the skin and wash                       Gently with a scrungie or clean washcloth.  5.  Apply the CHG Soap to your body ONLY FROM THE NECK DOWN.   Do not use on face/ open                           Wound or open sores. Avoid contact with eyes, ears mouth and genitals (private parts).  Wash face,  Genitals (private parts) with your normal soap.             6.  Wash thoroughly, paying special attention to the area where your surgery  will be performed.  7.  Thoroughly rinse your body with warm water from the neck down.  8.  DO NOT shower/wash with your normal soap after using and rinsing off  the CHG Soap.                9.  Pat yourself dry with a clean towel.            10.  Wear clean pajamas.            11.  Place clean sheets on your bed the night of your first shower and do not  sleep with pets. Day of Surgery : Do not apply any lotions/deodorants the morning of surgery.  Please wear clean clothes to the hospital/surgery center.  FAILURE TO FOLLOW THESE INSTRUCTIONS MAY RESULT IN THE CANCELLATION OF YOUR SURGERY PATIENT SIGNATURE_________________________________  NURSE SIGNATURE__________________________________  ________________________________________________________________________

## 2014-05-12 ENCOUNTER — Encounter (HOSPITAL_COMMUNITY): Payer: Self-pay

## 2014-05-12 ENCOUNTER — Encounter (HOSPITAL_COMMUNITY)
Admission: RE | Admit: 2014-05-12 | Discharge: 2014-05-12 | Disposition: A | Discharge status: Home / Self Care | Payer: Managed Care, Other (non HMO) | Source: Ambulatory Visit | Attending: General Surgery | Admitting: General Surgery

## 2014-05-12 ENCOUNTER — Ambulatory Visit: Payer: BC Managed Care – PPO | Admitting: Dietician

## 2014-05-12 DIAGNOSIS — Z01818 Encounter for other preprocedural examination: Secondary | ICD-10-CM | POA: Diagnosis present

## 2014-05-12 HISTORY — DX: Depression, unspecified: F32.A

## 2014-05-12 HISTORY — DX: Major depressive disorder, single episode, unspecified: F32.9

## 2014-05-12 HISTORY — DX: Bipolar disorder, unspecified: F31.9

## 2014-05-12 HISTORY — DX: Tremor, unspecified: R25.1

## 2014-05-12 HISTORY — DX: Personal history of other diseases of the digestive system: Z87.19

## 2014-05-12 LAB — COMPREHENSIVE METABOLIC PANEL
ALK PHOS: 82 U/L (ref 39–117)
ALT: 28 U/L (ref 0–35)
AST: 22 U/L (ref 0–37)
Albumin: 4.4 g/dL (ref 3.5–5.2)
Anion gap: 6 (ref 5–15)
BUN: 9 mg/dL (ref 6–23)
CALCIUM: 9.6 mg/dL (ref 8.4–10.5)
CHLORIDE: 102 mmol/L (ref 96–112)
CO2: 27 mmol/L (ref 19–32)
Creatinine, Ser: 0.95 mg/dL (ref 0.50–1.10)
GFR calc Af Amer: 88 mL/min — ABNORMAL LOW (ref >90)
GFR, EST NON AFRICAN AMERICAN: 76 mL/min — AB (ref >90)
Glucose, Bld: 90 mg/dL (ref 70–99)
POTASSIUM: 4 mmol/L (ref 3.5–5.1)
Sodium: 135 mmol/L (ref 135–145)
Total Bilirubin: 0.5 mg/dL (ref 0.3–1.2)
Total Protein: 8 g/dL (ref 6.0–8.3)

## 2014-05-12 LAB — CBC WITH DIFFERENTIAL/PLATELET
BASOS PCT: 1 % (ref 0–1)
Basophils Absolute: 0.1 10*3/uL (ref 0.0–0.1)
Eosinophils Absolute: 0.3 10*3/uL (ref 0.0–0.7)
Eosinophils Relative: 4 % (ref 0–5)
HCT: 38.7 % (ref 36.0–46.0)
Hemoglobin: 12.7 g/dL (ref 12.0–15.0)
LYMPHS ABS: 3.1 10*3/uL (ref 0.7–4.0)
Lymphocytes Relative: 37 % (ref 12–46)
MCH: 28.3 pg (ref 26.0–34.0)
MCHC: 32.8 g/dL (ref 30.0–36.0)
MCV: 86.2 fL (ref 78.0–100.0)
MONO ABS: 0.4 10*3/uL (ref 0.1–1.0)
MONOS PCT: 5 % (ref 3–12)
NEUTROS ABS: 4.5 10*3/uL (ref 1.7–7.7)
NEUTROS PCT: 53 % (ref 43–77)
Platelets: 395 10*3/uL (ref 150–400)
RBC: 4.49 MIL/uL (ref 3.87–5.11)
RDW: 13.6 % (ref 11.5–15.5)
WBC: 8.4 10*3/uL (ref 4.0–10.5)

## 2014-05-12 LAB — NO BLOOD PRODUCTS

## 2014-05-12 NOTE — Progress Notes (Signed)
Chest x ray, ekg 9/15 epic

## 2014-05-12 NOTE — Progress Notes (Signed)
Patient states dr Andrey Campanilewilson and she have talked about her desire for no blood products.  Faxed release to lab with [place]

## 2014-05-18 ENCOUNTER — Encounter (HOSPITAL_COMMUNITY)
Admission: RE | Disposition: A | Discharge status: Home / Self Care | Payer: Self-pay | Source: Ambulatory Visit | Attending: General Surgery

## 2014-05-18 ENCOUNTER — Inpatient Hospital Stay (HOSPITAL_COMMUNITY): Payer: Managed Care, Other (non HMO) | Admitting: Anesthesiology

## 2014-05-18 ENCOUNTER — Encounter (HOSPITAL_COMMUNITY): Payer: Self-pay | Admitting: *Deleted

## 2014-05-18 ENCOUNTER — Inpatient Hospital Stay (HOSPITAL_COMMUNITY)
Admission: RE | Admit: 2014-05-18 | Discharge: 2014-05-20 | DRG: 621 | Disposition: A | Discharge status: Home / Self Care | Payer: Managed Care, Other (non HMO) | Source: Ambulatory Visit | Attending: General Surgery | Admitting: General Surgery

## 2014-05-18 DIAGNOSIS — Z79899 Other long term (current) drug therapy: Secondary | ICD-10-CM

## 2014-05-18 DIAGNOSIS — E559 Vitamin D deficiency, unspecified: Secondary | ICD-10-CM | POA: Diagnosis present

## 2014-05-18 DIAGNOSIS — Z6841 Body Mass Index (BMI) 40.0 and over, adult: Secondary | ICD-10-CM

## 2014-05-18 DIAGNOSIS — K210 Gastro-esophageal reflux disease with esophagitis: Secondary | ICD-10-CM | POA: Diagnosis present

## 2014-05-18 DIAGNOSIS — K449 Diaphragmatic hernia without obstruction or gangrene: Secondary | ICD-10-CM

## 2014-05-18 DIAGNOSIS — F411 Generalized anxiety disorder: Secondary | ICD-10-CM | POA: Diagnosis present

## 2014-05-18 DIAGNOSIS — Z01812 Encounter for preprocedural laboratory examination: Secondary | ICD-10-CM

## 2014-05-18 DIAGNOSIS — K219 Gastro-esophageal reflux disease without esophagitis: Secondary | ICD-10-CM | POA: Diagnosis present

## 2014-05-18 DIAGNOSIS — Z87891 Personal history of nicotine dependence: Secondary | ICD-10-CM | POA: Diagnosis not present

## 2014-05-18 DIAGNOSIS — K5900 Constipation, unspecified: Secondary | ICD-10-CM | POA: Diagnosis present

## 2014-05-18 DIAGNOSIS — M170 Bilateral primary osteoarthritis of knee: Secondary | ICD-10-CM | POA: Diagnosis present

## 2014-05-18 DIAGNOSIS — Z9884 Bariatric surgery status: Secondary | ICD-10-CM

## 2014-05-18 DIAGNOSIS — F319 Bipolar disorder, unspecified: Secondary | ICD-10-CM | POA: Diagnosis present

## 2014-05-18 DIAGNOSIS — R110 Nausea: Secondary | ICD-10-CM

## 2014-05-18 HISTORY — PX: UPPER GI ENDOSCOPY: SHX6162

## 2014-05-18 HISTORY — PX: LAPAROSCOPIC GASTRIC SLEEVE RESECTION WITH HIATAL HERNIA REPAIR: SHX6512

## 2014-05-18 LAB — HEMOGLOBIN AND HEMATOCRIT, BLOOD
HEMATOCRIT: 37.7 % (ref 36.0–46.0)
HEMOGLOBIN: 12.4 g/dL (ref 12.0–15.0)

## 2014-05-18 LAB — PREGNANCY, URINE: Preg Test, Ur: NEGATIVE

## 2014-05-18 SURGERY — GASTRECTOMY, SLEEVE, LAPAROSCOPIC, WITH HIATAL HERNIA REPAIR
Anesthesia: General | Site: Mouth

## 2014-05-18 MED ORDER — CHLORHEXIDINE GLUCONATE 4 % EX LIQD: 60.0000 mL | Freq: Once | CUTANEOUS | Status: DC

## 2014-05-18 MED ORDER — HEPARIN SODIUM (PORCINE) 5000 UNIT/ML IJ SOLN
5000.0000 [IU] | INTRAMUSCULAR | Status: AC
Start: 2014-05-18 — End: 2014-05-18
  Administered 2014-05-18: 5000 [IU] via SUBCUTANEOUS
  Filled 2014-05-18: qty 1

## 2014-05-18 MED ORDER — NEOSTIGMINE METHYLSULFATE 10 MG/10ML IV SOLN
INTRAVENOUS | Status: AC
  Filled 2014-05-18: qty 1

## 2014-05-18 MED ORDER — MIDAZOLAM HCL 2 MG/2ML IJ SOLN
INTRAMUSCULAR | Status: AC
  Filled 2014-05-18: qty 2

## 2014-05-18 MED ORDER — UNJURY CHICKEN SOUP POWDER: 2.0000 [oz_av] | Freq: Four times a day (QID) | ORAL | Status: DC

## 2014-05-18 MED ORDER — PROPOFOL 10 MG/ML IV BOLUS
INTRAVENOUS | Status: DC | PRN
  Administered 2014-05-18: 200 mg via INTRAVENOUS

## 2014-05-18 MED ORDER — ENOXAPARIN SODIUM 30 MG/0.3ML ~~LOC~~ SOLN
30.0000 mg | Freq: Two times a day (BID) | SUBCUTANEOUS | Status: DC
  Administered 2014-05-19 – 2014-05-20 (×3): 30 mg via SUBCUTANEOUS
  Filled 2014-05-18 (×5): qty 0.3

## 2014-05-18 MED ORDER — MORPHINE SULFATE 2 MG/ML IJ SOLN
2.0000 mg | INTRAMUSCULAR | Status: DC | PRN
  Administered 2014-05-18 – 2014-05-19 (×5): 4 mg via INTRAVENOUS
  Filled 2014-05-18 (×5): qty 2

## 2014-05-18 MED ORDER — FENTANYL CITRATE 0.05 MG/ML IJ SOLN
INTRAMUSCULAR | Status: DC | PRN
  Administered 2014-05-18 (×4): 50 ug via INTRAVENOUS
  Administered 2014-05-18: 100 ug via INTRAVENOUS
  Administered 2014-05-18 (×5): 50 ug via INTRAVENOUS
  Administered 2014-05-18: 100 ug via INTRAVENOUS
  Administered 2014-05-18: 50 ug via INTRAVENOUS

## 2014-05-18 MED ORDER — LACTATED RINGERS IV SOLN
INTRAVENOUS | Status: DC
Start: 2014-05-18 — End: 2014-05-18

## 2014-05-18 MED ORDER — ROCURONIUM BROMIDE 100 MG/10ML IV SOLN
INTRAVENOUS | Status: AC
  Filled 2014-05-18: qty 1

## 2014-05-18 MED ORDER — NEOSTIGMINE METHYLSULFATE 10 MG/10ML IV SOLN
INTRAVENOUS | Status: DC | PRN
  Administered 2014-05-18: 4 mg via INTRAVENOUS

## 2014-05-18 MED ORDER — DEXTROSE 5 % IV SOLN
INTRAVENOUS | Status: AC
  Filled 2014-05-18: qty 2

## 2014-05-18 MED ORDER — PROPOFOL 10 MG/ML IV BOLUS
INTRAVENOUS | Status: AC
  Filled 2014-05-18: qty 20

## 2014-05-18 MED ORDER — OXYCODONE HCL 5 MG/5ML PO SOLN
5.0000 mg | ORAL | Status: DC | PRN
  Administered 2014-05-19 – 2014-05-20 (×4): 10 mg via ORAL
  Filled 2014-05-18 (×4): qty 10

## 2014-05-18 MED ORDER — LIDOCAINE HCL (CARDIAC) 20 MG/ML IV SOLN
INTRAVENOUS | Status: DC | PRN
  Administered 2014-05-18: 80 mg via INTRAVENOUS

## 2014-05-18 MED ORDER — FENTANYL CITRATE 0.05 MG/ML IJ SOLN
INTRAMUSCULAR | Status: AC
  Filled 2014-05-18: qty 2

## 2014-05-18 MED ORDER — CETYLPYRIDINIUM CHLORIDE 0.05 % MT LIQD
7.0000 mL | Freq: Two times a day (BID) | OROMUCOSAL | Status: DC
  Administered 2014-05-18 – 2014-05-19 (×3): 7 mL via OROMUCOSAL

## 2014-05-18 MED ORDER — ACETAMINOPHEN 160 MG/5ML PO SOLN: 325.0000 mg | ORAL | Status: DC | PRN

## 2014-05-18 MED ORDER — CHLORHEXIDINE GLUCONATE 0.12 % MT SOLN
15.0000 mL | Freq: Two times a day (BID) | OROMUCOSAL | Status: DC
  Administered 2014-05-18 – 2014-05-20 (×4): 15 mL via OROMUCOSAL
  Filled 2014-05-18 (×5): qty 15

## 2014-05-18 MED ORDER — DEXAMETHASONE SODIUM PHOSPHATE 10 MG/ML IJ SOLN
INTRAMUSCULAR | Status: DC | PRN
  Administered 2014-05-18: 10 mg via INTRAVENOUS

## 2014-05-18 MED ORDER — HYDROMORPHONE HCL 1 MG/ML IJ SOLN
INTRAMUSCULAR | Status: AC
  Filled 2014-05-18: qty 1

## 2014-05-18 MED ORDER — UNJURY VANILLA POWDER
2.0000 [oz_av] | Freq: Four times a day (QID) | ORAL | Status: DC
  Administered 2014-05-20: 2 [oz_av] via ORAL

## 2014-05-18 MED ORDER — PANTOPRAZOLE SODIUM 40 MG IV SOLR
40.0000 mg | Freq: Every day | INTRAVENOUS | Status: DC
  Administered 2014-05-18 – 2014-05-19 (×2): 40 mg via INTRAVENOUS
  Filled 2014-05-18 (×3): qty 40

## 2014-05-18 MED ORDER — MEPERIDINE HCL 50 MG/ML IJ SOLN: 6.2500 mg | INTRAMUSCULAR | Status: DC | PRN

## 2014-05-18 MED ORDER — KCL IN DEXTROSE-NACL 20-5-0.45 MEQ/L-%-% IV SOLN
INTRAVENOUS | Status: DC
  Administered 2014-05-18: 12:00:00 via INTRAVENOUS
  Administered 2014-05-19: 1000 mL via INTRAVENOUS
  Administered 2014-05-19: 10:00:00 via INTRAVENOUS
  Administered 2014-05-19 – 2014-05-20 (×2): 1000 mL via INTRAVENOUS
  Filled 2014-05-18 (×6): qty 1000

## 2014-05-18 MED ORDER — FENTANYL CITRATE 0.05 MG/ML IJ SOLN
INTRAMUSCULAR | Status: AC
  Filled 2014-05-18: qty 5

## 2014-05-18 MED ORDER — BUPIVACAINE-EPINEPHRINE 0.25% -1:200000 IJ SOLN
INTRAMUSCULAR | Status: DC | PRN
  Administered 2014-05-18: 30 mL

## 2014-05-18 MED ORDER — SODIUM CHLORIDE 0.9 % IJ SOLN
INTRAMUSCULAR | Status: AC
  Filled 2014-05-18: qty 50

## 2014-05-18 MED ORDER — ESMOLOL HCL 10 MG/ML IV SOLN
INTRAVENOUS | Status: AC
  Filled 2014-05-18: qty 10

## 2014-05-18 MED ORDER — LACTATED RINGERS IR SOLN
Status: DC | PRN
  Administered 2014-05-18: 1000 mL

## 2014-05-18 MED ORDER — GLYCOPYRROLATE 0.2 MG/ML IJ SOLN
INTRAMUSCULAR | Status: AC
  Filled 2014-05-18: qty 4

## 2014-05-18 MED ORDER — UNJURY CHOCOLATE CLASSIC POWDER
2.0000 [oz_av] | Freq: Four times a day (QID) | ORAL | Status: DC
Start: 2014-05-20 — End: 2014-05-20

## 2014-05-18 MED ORDER — CEFOTETAN DISODIUM-DEXTROSE 2-2.08 GM-% IV SOLR
INTRAVENOUS | Status: AC
  Filled 2014-05-18: qty 50

## 2014-05-18 MED ORDER — LACTATED RINGERS IV SOLN
INTRAVENOUS | Status: DC | PRN
  Administered 2014-05-18 (×2): via INTRAVENOUS

## 2014-05-18 MED ORDER — PROMETHAZINE HCL 25 MG/ML IJ SOLN
12.5000 mg | Freq: Four times a day (QID) | INTRAMUSCULAR | Status: DC | PRN
  Administered 2014-05-18: 12.5 mg via INTRAVENOUS
  Filled 2014-05-18: qty 1

## 2014-05-18 MED ORDER — BUPIVACAINE-EPINEPHRINE (PF) 0.25% -1:200000 IJ SOLN
INTRAMUSCULAR | Status: AC
  Filled 2014-05-18: qty 30

## 2014-05-18 MED ORDER — ACETAMINOPHEN 160 MG/5ML PO SOLN: 650.0000 mg | ORAL | Status: DC | PRN

## 2014-05-18 MED ORDER — GLYCOPYRROLATE 0.2 MG/ML IJ SOLN
INTRAMUSCULAR | Status: DC | PRN
  Administered 2014-05-18: 0.6 mg via INTRAVENOUS
  Administered 2014-05-18: 0.1 mg via INTRAVENOUS

## 2014-05-18 MED ORDER — ROCURONIUM BROMIDE 100 MG/10ML IV SOLN
INTRAVENOUS | Status: DC | PRN
Start: 2014-05-18 — End: 2014-05-18
  Administered 2014-05-18 (×2): 50 mg via INTRAVENOUS
  Administered 2014-05-18 (×2): 20 mg via INTRAVENOUS

## 2014-05-18 MED ORDER — ONDANSETRON HCL 4 MG/2ML IJ SOLN
INTRAMUSCULAR | Status: DC | PRN
  Administered 2014-05-18: 4 mg via INTRAVENOUS

## 2014-05-18 MED ORDER — METOCLOPRAMIDE HCL 5 MG/ML IJ SOLN
INTRAMUSCULAR | Status: DC | PRN
Start: 2014-05-18 — End: 2014-05-18
  Administered 2014-05-18: 10 mg via INTRAVENOUS

## 2014-05-18 MED ORDER — ONDANSETRON HCL 4 MG/2ML IJ SOLN
INTRAMUSCULAR | Status: AC
  Filled 2014-05-18: qty 2

## 2014-05-18 MED ORDER — ESMOLOL HCL 10 MG/ML IV SOLN
INTRAVENOUS | Status: DC | PRN
  Administered 2014-05-18: 20 mg via INTRAVENOUS

## 2014-05-18 MED ORDER — ONDANSETRON HCL 4 MG/2ML IJ SOLN
4.0000 mg | INTRAMUSCULAR | Status: DC | PRN
  Administered 2014-05-18 – 2014-05-19 (×2): 4 mg via INTRAVENOUS
  Filled 2014-05-18 (×2): qty 2

## 2014-05-18 MED ORDER — SODIUM CHLORIDE 0.9 % IJ SOLN
INTRAMUSCULAR | Status: DC | PRN
  Administered 2014-05-18: 50 mL

## 2014-05-18 MED ORDER — FENTANYL CITRATE 0.05 MG/ML IJ SOLN
25.0000 ug | INTRAMUSCULAR | Status: DC | PRN
  Administered 2014-05-18 (×2): 50 ug via INTRAVENOUS

## 2014-05-18 MED ORDER — CEFOTETAN DISODIUM-DEXTROSE 2-2.08 GM-% IV SOLR
2.0000 g | INTRAVENOUS | Status: AC
  Administered 2014-05-18: 2 g via INTRAVENOUS

## 2014-05-18 MED ORDER — LIDOCAINE HCL (CARDIAC) 20 MG/ML IV SOLN
INTRAVENOUS | Status: AC
  Filled 2014-05-18: qty 5

## 2014-05-18 MED ORDER — MIDAZOLAM HCL 5 MG/5ML IJ SOLN
INTRAMUSCULAR | Status: DC | PRN
  Administered 2014-05-18 (×2): 1 mg via INTRAVENOUS

## 2014-05-18 MED ORDER — PHENOL 1.4 % MT LIQD
1.0000 | OROMUCOSAL | Status: DC | PRN
Start: 2014-05-18 — End: 2014-05-20
  Administered 2014-05-18: 1 via OROMUCOSAL
  Filled 2014-05-18: qty 177

## 2014-05-18 MED ORDER — PROMETHAZINE HCL 25 MG/ML IJ SOLN: 6.2500 mg | INTRAMUSCULAR | Status: DC | PRN

## 2014-05-18 MED ORDER — MENTHOL 3 MG MT LOZG
1.0000 | LOZENGE | OROMUCOSAL | Status: DC | PRN
  Administered 2014-05-18: 3 mg via ORAL
  Filled 2014-05-18: qty 9

## 2014-05-18 MED ORDER — HYDROMORPHONE HCL 1 MG/ML IJ SOLN
0.2500 mg | INTRAMUSCULAR | Status: DC | PRN
Start: 2014-05-18 — End: 2014-05-18
  Administered 2014-05-18: 0.5 mg via INTRAVENOUS

## 2014-05-18 MED ORDER — BUPIVACAINE LIPOSOME 1.3 % IJ SUSP
20.0000 mL | Freq: Once | INTRAMUSCULAR | Status: AC
  Administered 2014-05-18: 20 mL
  Filled 2014-05-18: qty 20

## 2014-05-18 SURGICAL SUPPLY — 67 items
APL SRG 32X5 SNPLK LF DISP (MISCELLANEOUS)
APPLICATOR COTTON TIP 6IN STRL (MISCELLANEOUS) IMPLANT
APPLIER CLIP ROT 10 11.4 M/L (STAPLE)
APR CLP MED LRG 11.4X10 (STAPLE)
BLADE SURG SZ11 CARB STEEL (BLADE) ×4 IMPLANT
CABLE HIGH FREQUENCY MONO STRZ (ELECTRODE) IMPLANT
CHLORAPREP W/TINT 26ML (MISCELLANEOUS) ×6 IMPLANT
CLIP APPLIE ROT 10 11.4 M/L (STAPLE) IMPLANT
DEVICE SUT QUICK LOAD TK 5 (STAPLE) ×2 IMPLANT
DEVICE SUT TI-KNOT TK 5X26 (MISCELLANEOUS) ×2 IMPLANT
DEVICE SUTURE ENDOST 10MM (ENDOMECHANICALS) ×2 IMPLANT
DEVICE TI KNOT TK5 (MISCELLANEOUS) ×2
DEVICE TROCAR PUNCTURE CLOSURE (ENDOMECHANICALS) ×4 IMPLANT
DRAPE CAMERA CLOSED 9X96 (DRAPES) ×4 IMPLANT
DRAPE UTILITY XL STRL (DRAPES) ×8 IMPLANT
ELECT REM PT RETURN 9FT ADLT (ELECTROSURGICAL) ×4
ELECTRODE REM PT RTRN 9FT ADLT (ELECTROSURGICAL) ×2 IMPLANT
GAUZE SPONGE 4X4 12PLY STRL (GAUZE/BANDAGES/DRESSINGS) IMPLANT
GLOVE BIOGEL M STRL SZ7.5 (GLOVE) ×4 IMPLANT
GOWN STRL REUS W/TWL XL LVL3 (GOWN DISPOSABLE) ×12 IMPLANT
HOVERMATT SINGLE USE (MISCELLANEOUS) ×4 IMPLANT
KIT BASIN OR (CUSTOM PROCEDURE TRAY) ×4 IMPLANT
LIQUID BAND (GAUZE/BANDAGES/DRESSINGS) ×4 IMPLANT
NDL SPNL 22GX3.5 QUINCKE BK (NEEDLE) ×1 IMPLANT
NEEDLE SPNL 22GX3.5 QUINCKE BK (NEEDLE) ×4 IMPLANT
PACK UNIVERSAL I (CUSTOM PROCEDURE TRAY) ×4 IMPLANT
PEN SKIN MARKING BROAD (MISCELLANEOUS) ×4 IMPLANT
QUICK LOAD TK 5 (STAPLE) ×2
RELOAD STAPLE 60 3.6 BLU REG (STAPLE) IMPLANT
RELOAD STAPLE 60 3.8 GOLD REG (STAPLE) IMPLANT
RELOAD STAPLE 60 4.1 GRN THCK (STAPLE) IMPLANT
RELOAD STAPLE 60 BLK VRY/THCK (STAPLE) IMPLANT
RELOAD STAPLER 60MM BLK (STAPLE) ×4 IMPLANT
RELOAD STAPLER BLUE 60MM (STAPLE) ×4 IMPLANT
RELOAD STAPLER GOLD 60MM (STAPLE) ×2 IMPLANT
RELOAD STAPLER GREEN 60MM (STAPLE) ×4 IMPLANT
SCISSORS LAP 5X35 DISP (ENDOMECHANICALS) ×2 IMPLANT
SCISSORS LAP 5X45 EPIX DISP (ENDOMECHANICALS) ×4 IMPLANT
SEALANT SURGICAL APPL DUAL CAN (MISCELLANEOUS) ×1 IMPLANT
SET IRRIG TUBING LAPAROSCOPIC (IRRIGATION / IRRIGATOR) ×4 IMPLANT
SHEARS CURVED HARMONIC AC 45CM (MISCELLANEOUS) ×4 IMPLANT
SLEEVE ADV FIXATION 5X100MM (TROCAR) ×8 IMPLANT
SLEEVE GASTRECTOMY 36FR VISIGI (MISCELLANEOUS) ×4 IMPLANT
SLEEVE XCEL OPT CAN 5 100 (ENDOMECHANICALS) IMPLANT
SOLUTION ANTI FOG 6CC (MISCELLANEOUS) ×4 IMPLANT
SPONGE LAP 18X18 X RAY DECT (DISPOSABLE) ×4 IMPLANT
STAPLER ECHELON BIOABSB 60 FLE (MISCELLANEOUS) ×14 IMPLANT
STAPLER ECHELON LONG 60 440 (INSTRUMENTS) IMPLANT
STAPLER RELOAD 60MM BLK (STAPLE) ×8
STAPLER RELOAD BLUE 60MM (STAPLE) ×8
STAPLER RELOAD GOLD 60MM (STAPLE) ×4
STAPLER RELOAD GREEN 60MM (STAPLE) ×8
SUT MNCRL AB 4-0 PS2 18 (SUTURE) ×4 IMPLANT
SUT SURGIDAC NAB ES-9 0 48 120 (SUTURE) ×2 IMPLANT
SUT VICRYL 0 TIES 12 18 (SUTURE) ×4 IMPLANT
SYR 20CC LL (SYRINGE) ×4 IMPLANT
SYR 50ML LL SCALE MARK (SYRINGE) ×4 IMPLANT
TOWEL OR 17X26 10 PK STRL BLUE (TOWEL DISPOSABLE) ×4 IMPLANT
TOWEL OR NON WOVEN STRL DISP B (DISPOSABLE) ×4 IMPLANT
TRAY FOLEY CATH 14FRSI W/METER (CATHETERS) IMPLANT
TROCAR ADV FIXATION 5X100MM (TROCAR) ×4 IMPLANT
TROCAR BLADELESS 15MM (ENDOMECHANICALS) IMPLANT
TROCAR BLADELESS OPT 5 100 (ENDOMECHANICALS) ×4 IMPLANT
TUBING CONNECTING 10 (TUBING) ×3 IMPLANT
TUBING CONNECTING 10' (TUBING) ×1
TUBING ENDO SMARTCAP (MISCELLANEOUS) ×4 IMPLANT
TUBING FILTER THERMOFLATOR (ELECTROSURGICAL) ×4 IMPLANT

## 2014-05-18 NOTE — Progress Notes (Signed)
Hgb. And Hct. Drawn by lab. 

## 2014-05-18 NOTE — Anesthesia Postprocedure Evaluation (Signed)
  Anesthesia Post-op Note  Patient: Meredith Cochran  Procedure(s) Performed: Procedure(s) (LRB): LAPAROSCOPIC GASTRIC SLEEVE RESECTION WITH HIATAL HERNIA REPAIR (N/A) UPPER GI ENDOSCOPY (N/A)  Patient Location: PACU  Anesthesia Type: General  Level of Consciousness: awake and alert   Airway and Oxygen Therapy: Patient Spontanous Breathing  Post-op Pain: mild  Post-op Assessment: Post-op Vital signs reviewed, Patient's Cardiovascular Status Stable, Respiratory Function Stable, Patent Airway and No signs of Nausea or vomiting  Last Vitals:  Filed Vitals:   05/18/14 1134  BP: 125/69  Pulse: 64  Temp: 36.4 C  Resp: 12    Post-op Vital Signs: stable   Complications: No apparent anesthesia complications

## 2014-05-18 NOTE — Anesthesia Preprocedure Evaluation (Addendum)
Anesthesia Evaluation  Patient identified by MRN, date of birth, ID band Patient awake    Reviewed: Allergy & Precautions, NPO status , Patient's Chart, lab work & pertinent test results  Airway Mallampati: I  TM Distance: >3 FB Neck ROM: Full    Dental no notable dental hx.    Pulmonary asthma , former smoker,  breath sounds clear to auscultation  Pulmonary exam normal       Cardiovascular negative cardio ROS  Rhythm:Regular Rate:Normal     Neuro/Psych Anxiety Depression Bipolar Disorder negative neurological ROS     GI/Hepatic Neg liver ROS, hiatal hernia,   Endo/Other  Morbid obesity  Renal/GU negative Renal ROS  negative genitourinary   Musculoskeletal negative musculoskeletal ROS (+)   Abdominal   Peds negative pediatric ROS (+)  Hematology negative hematology ROS (+)   Anesthesia Other Findings   Reproductive/Obstetrics negative OB ROS                            Anesthesia Physical Anesthesia Plan  ASA: III  Anesthesia Plan: General   Post-op Pain Management:    Induction: Intravenous  Airway Management Planned: Oral ETT  Additional Equipment:   Intra-op Plan:   Post-operative Plan: Extubation in OR  Informed Consent: I have reviewed the patients History and Physical, chart, labs and discussed the procedure including the risks, benefits and alternatives for the proposed anesthesia with the patient or authorized representative who has indicated his/her understanding and acceptance.   Dental advisory given  Plan Discussed with: CRNA  Anesthesia Plan Comments:         Anesthesia Quick Evaluation

## 2014-05-18 NOTE — Interval H&P Note (Signed)
History and Physical Interval Note:  05/18/2014 7:05 AM  Meredith Cochran  has presented today for surgery, with the diagnosis of Morbid Obesity  The various methods of treatment have been discussed with the patient and family. After consideration of risks, benefits and other options for treatment, the patient has consented to  Procedure(s): LAPAROSCOPIC GASTRIC SLEEVE RESECTION WITH HIATAL HERNIA REPAIR (N/A) UPPER GI ENDOSCOPY (N/A) as a surgical intervention .  The patient's history has been reviewed, patient examined, no change in status, stable for surgery.  I have reviewed the patient's chart and labs.  Questions were answered to the patient's satisfaction.    Mary SellaEric M. Andrey CampanileWilson, MD, FACS General, Bariatric, & Minimally Invasive Surgery [facility] [place]    [facility] LLC[facility]ILSON,[name]

## 2014-05-18 NOTE — H&P (View-Only) (Signed)
Meredith Cochran 05/07/2014 9:29 AM Location: [facility] Patient #: [id number] DOB: 12/11/1975 Single / Language: Meredith Cochran / Race: Black or African American Female History of Present Illness Meredith Cochran M. Zareah Hunzeker MD; 05/07/2014 10:01 AM) The patient is a 39 year old female who presents for a pre-op visit. She is currently scheduled for laparoscopic sleeve gastrectomy with hiatal hernia repair with upper endoscopy on March 7. I initially met her in late September 2015. Her weight at that time was 294 pounds. She denies any significant medical changes since I initially met her. She denies any trips to the emergency room or hospital. She denies any new medical diagnoses. She has been following the nutritionist for plan and has lost around 30 pounds. She is taking supplemental vitamin D for vitamin D deficiency. She is a Restaurant manager, fast food. She reports some ongoing reflux issues as well as intermittent constipation. She denies any chest pain, chest pressure, shortness of breath, paroxysmal nocturnal dyspnea, TIAs or amaurosis fugax. She denies any melena or hematochezia. She reports normal urination. She is still seeing her psychiatrist on a regular basis. Her chest x-ray was within normal limits. Her upper GI showed a moderate size hiatal hernia. Her initial creatinine evaluation labs were within normal limits except for vitamin D deficiency. Her creatinine was around 1.1. There were no signs of anemia. 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.46  M25.561, M25.562) OBESITY, MORBID, BMI 40.0-49.9 (278.01  E66.01) HIATAL HERNIA (553.3  K44.9) BIPOLAR AFFECTIVE DISORDER, REMISSION STATUS UNSPECIFIED (296.80  F31.9) VITAMIN D DEFICIENCY (268.9  E55.9) 28.6 on 09/10/13 Anxiety  Disorder Depression  Past Surgical History Meredith Curry, MD; 05/07/2014 10:01 AM) No pertinent past surgical history  Diagnostic Studies History Meredith Curry, MD; 05/07/2014 10:01 AM) Pap Smear 1-5 years ago Colonoscopy never Mammogram never  Allergies Meredith Curry, MD; 05/07/2014 10:01 AM) No Known Drug Allergies 12/04/2013  Medication History Meredith Curry, MD; 05/07/2014 10:01 AM) Vitamin D (Ergocalciferol) (50000UNIT Capsule, Oral) Active. LamoTRIgine (200MG Tablet, 2 Oral daily) Active. Saphris (10MG Tab Sublingual, Sublingual) Active. Adderall XR (15MG Capsule ER 24HR, Oral) Active. Multivitamins (Oral) Active. KlonoPIN (1MG Tablet, Oral) Active. Advil (200MG Capsule, Oral) Active. Protonix (40MG Packet, Oral) Active. Vitamin D (50000U Tablet, Oral) Active. Medications Reconciled OxyCODONE HCl (5MG/5ML Solution, 5-10 Milliliter Oral every four hours, as needed, Taken starting 05/07/2014) Active.  Social History Meredith Curry, MD; 06/26/6061 01:60 AM) Illicit drug use Remotely quit drug use. Tobacco use Former smoker. Caffeine use Coffee. Alcohol use Moderate alcohol use. HAS A GLASS OF WINE ABOUT 4 NIGHTS A WEEK  Family History Meredith Curry, MD; 05/07/2014 10:01 AM) Breast Cancer Family Members In General. Cancer Family Members In General. Thyroid problems Family Members In General. Respiratory Condition Family Members In General. Diabetes Mellitus Family Members In General. Hypertension Family Members In General. Migraine Headache Mother.  Pregnancy / Birth History Meredith Curry, MD; 05/07/2014 10:01 AM) Meredith Cochran 3 Maternal Cochran 84-25 Para 2 Contraceptive History Contraceptive implant, Depo-provera, Intrauterine device. Cochran at menarche 71 years. Regular periods     Review of Systems Meredith Cochran M. Treva Huyett MD; 05/07/2014 9:58 AM) General Present- Weight Gain. Not Present- Appetite Loss, Chills, Fatigue, Fever, Night Sweats and  Weight Loss. Skin Present- Dryness. Not Present- Change in Wart/Mole, Hives, Jaundice, New Lesions, Non-Healing Wounds, Rash and Ulcer. HEENT  Not Present- Earache, Hearing Loss, Hoarseness, Nose Bleed, Oral Ulcers, Ringing in the Ears, Seasonal Allergies, Sinus Pain, Sore Throat, Visual Disturbances, Wears glasses/contact lenses and Yellow Eyes. Respiratory Not Present- Bloody sputum, Chronic Cough, Difficulty Breathing, Snoring and Wheezing. Cardiovascular Present- Difficulty Breathing On Exertion (with stairs) and Swelling of Extremities. Not Present- Chest Pain, Difficulty Breathing Lying Down, Fainting, Fainting / Blacking Out, Leg Cramps, Orthopnea, Palpitations, Paroxysmal Nocturnal Dyspnea, Rapid Heart Rate and Shortness of Breath. Gastrointestinal Present- Excessive gas, Heartburn (USES PROTONIX daily; REFLUX OCCURS RANDOMLY) and Indigestion. Not Present- Abdominal Pain, Bloating, Bloody Stool, Change in Bowel Habits, Chronic diarrhea, Constipation, Difficulty Swallowing, Gets full quickly at meals, Hemorrhoids, Nausea, Rectal Pain and Vomiting. Female Genitourinary Present- Nocturia and Urgency. Not Present- Absence of Menstruation, Amenorrhea, Dysmenorrhea, Excessive Menstrual Bleeding, Excessive Non-Menstrual Bleeding, Frequency, Painful Urination and Pelvic Pain. Musculoskeletal Present- Joint Pain (BILATERAL KNEE PAIN. LIMITS WALKING) and Joint Stiffness. Not Present- Back Pain, Muscle Pain, Muscle Weakness and Swelling of Extremities. Neurological Present- Tingling and Tremor. Not Present- Decreased Memory, Fainting, Headaches, Numbness, Seizures, Trouble walking and Weakness. Psychiatric Present- Bipolar (NO MAJOR EPISODES - SEES DR Meredith Cochran [place] Meredith Cochran). Not Present- Anxiety, Change in Sleep Pattern, Depression, Fearful and Frequent crying. Endocrine Not Present- Cold Intolerance, Excessive Hunger, Hair Changes, Heat Intolerance, Hot flashes and New Diabetes. Hematology Not Present- Easy  Bruising, Excessive bleeding, Gland problems, HIV and Persistent Infections.  Vitals (Meredith Cochran; 05/07/2014 9:30 AM) 05/07/2014 9:29 AM Weight: 264.8 lb Height: 68in Body Surface Area: 2.4 m Body Mass Index: 40.26 kg/m Temp.: 97.53F(Oral)  Pulse: 86 (Regular)  BP: 142/82 (Sitting, Left Arm, Standard)     Physical Exam Meredith Cochran M. Dennisha Mouser MD; 05/07/2014 9:57 AM)  General Mental Status-Alert. General Appearance-Consistent with stated Cochran. Hydration-Well hydrated. Voice-Normal. Note: morbidly obese, evenly distributed   Head and Neck Head-normocephalic, atraumatic with no lesions or palpable masses. Trachea-midline. Thyroid Gland Characteristics - normal size and consistency.  Eye Eyeball - Bilateral-Extraocular movements intact. Sclera/Conjunctiva - Bilateral-No scleral icterus.  Chest and Lung Exam Chest and lung exam reveals -quiet, even and easy respiratory effort with no use of accessory muscles, normal resonance, no flatness or dullness and on auscultation, normal breath sounds, no adventitious sounds and normal vocal resonance. Inspection Chest Wall - Normal. Back - normal.  Breast - Did not examine.  Abdomen Inspection Inspection of the abdomen reveals - No Hernias. Skin - Scar - no surgical scars. Palpation/Percussion Palpation and Percussion of the abdomen reveal - Soft, Non Tender, No Rebound tenderness, No Rigidity (guarding) and No hepatosplenomegaly. Auscultation Auscultation of the abdomen reveals - Bowel sounds normal.  Rectal - Did not examine.  Peripheral Vascular Upper Extremity Inspection - Bilateral - Normal - No Clubbing, No Cyanosis, No Edema, Pulses Intact. Palpation - Pulses bilaterally normal. Lower Extremity Palpation - Pulses bilaterally normal.  Neurologic Neurologic evaluation reveals -alert and oriented x 3 with no impairment of recent or remote memory. Mental  Status-Normal. Sensory-Normal. Gait-Normal.  Musculoskeletal Normal Exam - Left-Upper Extremity Strength Normal and Lower Extremity Strength Normal. Normal Exam - Right-Upper Extremity Strength Normal and Lower Extremity Strength Normal.  Lymphatic Head & Neck  General Head & Neck Lymphatics: Bilateral - Description - Normal. Axillary - Did not examine. Femoral & Inguinal  Generalized Femoral & Inguinal Lymphatics: Bilateral - Description - Normal. Tenderness - Non Tender.    Assessment & Plan Meredith Cochran M. Kaijah Abts MD; 05/07/2014 9:55 AM)  OBESITY, MORBID, BMI 40.0-49.9 (278.01  E66.01) Impression: We reviewed her preoperative workup to  date. There is no signs of anemia. 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 On her upper GI did show a moderate size hiatal hernia. We discussed this. I explained that we will test for hiatal hernia during surgery and if found to have a clinically significant hiatal hernia I would proceed with hiatal hernia repair in order to decrease postoperative complications. We discussed this with involved dissecting out the left and right crus of the diaphragm and suturing the muscle back together. We discussed the typical recovery process. I advised her to maintain her current weight and not to lose any additional weight. She was given her postoperative pain medicine prescription today  Current Plans Started OxyCODONE HCl 5MG/5ML, 5-10 Milliliter every four hours, as needed, 200 Milliliter, 05/07/2014, No Refill. Instructions: Congratulations on starting your journey to a healthier life! Over the next few weeks you will be undergoing tests (x-rays and labs) and seeing specialists to help evaluate you for weight loss surgery. These tests and consultations with a psychologist and nutritionist are needed to prepare you for the lifestyle changes that lie ahead and are often required by insurance companies to approve you for surgery. Please call  me if you have any questions during the evaluation.  Pathway to Surgery:  Two weeks prior to surgery Maintain your current body weight Attend preoperative appointment with your surgeon Attend preoperative surgery class  One week prior to surgery No aspirin products. Tylenol is acceptable  24 hours prior to surgery No alcoholic beverages Report fever greater than 100.5 or excessive nasal drainage suggesting infection Continue bariatric preop diet Perform bowel prep if ordered Do not eat or drink anything after midnight the night before surgery Do not take any medications except those instructed by the anesthesiologist  Morning of surgery Please arrive at the hospital at least 2 hours before your scheduled surgery time. No makeup, fingernail polish or jewelry Bring insurance cards with you Bring your CPAP mask if you use this ARTHRALGIA OF BOTH KNEES (719.46  M25.561)  GASTROESOPHAGEAL REFLUX DISEASE, ESOPHAGITIS PRESENCE NOT SPECIFIED (530.81  K21.9)  VITAMIN D DEFICIENCY (268.9  E55.9) Story: 28.6 on 09/10/13 Impression: Currently taking 50,000 international units once a week. Continue current therapy  BIPOLAR AFFECTIVE DISORDER, REMISSION STATUS UNSPECIFIED (296.80  F31.9) Impression: per psychiatrist  HIATAL HERNIA (553.3  K44.9) Impression: Her upper GI did show evidence of a moderate size hiatal hernia. see above-mentioned discussion  TRANSFUSION OF BLOOD PRODUCT REFUSED FOR RELIGIOUS REASON (V62.6  Z53.1)  Leighton Ruff. Redmond Pulling, MD, FACS General, Bariatric, & Minimally Invasive Surgery [facility], [place]

## 2014-05-18 NOTE — Transfer of Care (Signed)
Immediate Anesthesia Transfer of Care Note  Patient: Meredith Cochran  Procedure(s) Performed: Procedure(s): LAPAROSCOPIC GASTRIC SLEEVE RESECTION WITH HIATAL HERNIA REPAIR (N/A) UPPER GI ENDOSCOPY (N/A)  Patient Location: PACU  Anesthesia Type:General  Level of Consciousness: awake, alert , oriented and patient cooperative  Airway & Oxygen Therapy: Patient Spontanous Breathing and Patient connected to face mask oxygen  Post-op Assessment: Report given to RN, Post -op Vital signs reviewed and stable and Post -op Vital signs reviewed and unstable, Anesthesiologist notified  Post vital signs: Reviewed and stable  Last Vitals:  Filed Vitals:   05/18/14 0525  BP: 140/73  Pulse: 81  Temp: 36.8 C  Resp: 18    Complications: No apparent anesthesia complications

## 2014-05-18 NOTE — Progress Notes (Signed)
Hgb. 12.4- Hct. 37.7- results noted

## 2014-05-18 NOTE — Op Note (Signed)
05/18/2014 Meredith Cochran 1975-09-24 [id number]   PRE-OPERATIVE DIAGNOSIS:   Morbid obesity BMI 40 GERD Hiatal Hernia OA bilateral knees Bipolar disorder Anxiety  Depression  POST-OPERATIVE DIAGNOSIS:  same  PROCEDURE:  Procedure(s): LAPAROSCOPIC SLEEVE GASTRECTOMY WITH HIATAL HERNIA REPAIR UPPER GI ENDOSCOPY  SURGEON:  Surgeon(s): Atilano Ina, MD FACS FASMBS  ASSISTANTS: Luretha Murphy MD FACS  ANESTHESIA:   general  DRAINS: none   BOUGIE: 36 fr ViSiGi  LOCAL MEDICATIONS USED:  MARCAINE +70cc  Exparel  SPECIMEN:  Source of Specimen:  Greater curvature of stomach  DISPOSITION OF SPECIMEN:  PATHOLOGY  COUNTS:  YES  INDICATION FOR PROCEDURE: This is a very pleasant 39 year old morbidly obese AAF who has had unsuccessful attempts for sustained weight loss. She presents today for a planned laparoscopic sleeve gastrectomy with upper endoscopy. We have discussed the risk and benefits of the procedure extensively preoperatively. Please see my separate notes.  PROCEDURE: After obtaining informed consent and receiving 5000 units of subcutaneous heparin, the patient was brought to the operating room at [facility] and placed supine on the operating room table. General endotracheal anesthesia was established. Sequential compression devices were placed. A Foley catheter was placed. A orogastric tube was placed. The patient's abdomen was prepped and draped in the usual standard surgical fashion. She received preoperative IV antibiotics. A surgical timeout was performed.  Access to the abdomen was achieved using a 5 mm 0 laparoscope thru a 5 mm trocar In the left upper Quadrant 2 fingerbreadths below the left subcostal margin using the Optiview technique. Pneumoperitoneum was smoothly established up to 15 mm of mercury. The laparoscope was advanced and the abdominal cavity was surveilled. There was evidence of a hiatal hernia on laparoscopy - gap in the left and right crus  anteriorly.  A 5 mm trocar was placed slightly above and to the left of the umbilicus under direct visualization. The patient was then placed in reverse Trendelenburg. The [facility] liver retractor was placed under the left lobe of the liver through a 5 mm trocar incision site in the subxiphoid position. A 5 mm trocar was placed in the lateral right upper quadrant along with a 15 mm trocar in the mid right abdomen  All under direct visualization after local had been infiltrated.  The stomach was inspected. It was completely decompressed and the orogastric tube was removed.  There is a small anterior dimple that was obviously visible. The calibration tube was placed in the oropharynx and guided down into the stomach by the CRNA. 10 mL of air was insufflated into the calibration balloon. The calibration tubing was then gently pulled back by the CRNA and it slid past the GE junction. At this point the calibration tubing was desufflated and pulled back into the esophagus. This confirmed my suspicion of a clinically significant hiatal hernia. The gastrohepatic ligament was incised with harmonic scalpel. The right crus was identified. We identified the crossing fat along the right crus. The adipose tissue just above this area was incised with harmonic scalpel. I then bluntly dissected out this area and identified the left crus. There was evidence of a hiatal hernia. I then mobilized the esophagus. The left and right crus were further mobilized with blunt dissection. I was then able to reapproximate the left and right crus with 0 Ethibond using an Endostitch suture device and securing it with a titanium tyknot. I placed a second suture in a similar fashion. We then had the CRNA readvanced the calibration tubing back into  the stomach. 10 mL of air was insufflated into the calibration tube balloon. The calibration tube was then gently pulled back and there was resistance at the GE junction. The tube did not slide back up  into the esophagus. At this point the calibration tubing was deflated and removed from the patient's body.   We identified the pylorus and measured 5-6 cm proximal to the pylorus and identified an area of where we would start taking down the short gastric vessels. Harmonic scalpel was used to take down the short gastric vessels along the greater curvature of the stomach. We were able to enter the lesser sac. We continued to march along the greater curvature of the stomach taking down the short gastrics. As we approached the gastrosplenic ligament we took care in this area not to injure the spleen. We were able to take down the entire gastrosplenic ligament. We then mobilized the fundus away from the left crus of diaphragm. There were not any significant posterior gastric avascular attachments. This left the stomach completely mobilized. No vessels had been taken down along the lesser curvature of the stomach.  We then reidentified the pylorus. A 36Fr ViSiGi was then placed in the oropharynx and advanced down into the stomach and placed in the distal antrum and positioned along the lesser curvature. It was placed under suction which secured the 36Fr ViSiGi in place along the lesser curve. Then using the Ethicon echelon 60 mm stapler with a black load with Seamguard, I placed a stapler along the antrum approximately 5 cm from the pylorus. The stapler was angled so that there is ample room at the angularis incisura. I then fired the first staple load after inspecting it posteriorly to ensure adequate space both anteriorly and posteriorly. At this point I still was not completely past the angularis so with another black load with Seamguard, I placed the stapler in position just inside the prior stapleline. We then rotated the stomach to insure that there was adequate anteriorly as well as posteriorly. The stapler was then fired. I used 60mm green cartridge with seamguard. At this point I started using 60 mm green  load staple cartridges with Seamguard. The echelon stapler was then repositioned with a 60 mm green load with Seamguard and we continued to march up along the ViSiGi. My assistant was holding traction along the greater curvature stomach along the cauterized short gastric vessels ensuring that the stomach was symmetrically retracted. Prior to each firing of the staple, we rotated the stomach to ensure that there is adequate stomach left.  As we approached the fundus, I used 60 mm gold cartridge with Seamguard aiming slightly lateral to the esophageal fat pad. Although the staples on this fire had completely gone thru the last part of the stomach it had not completely cut it. Therefore 1 additional 60 blue load was used to free the remaining stomach. The sleeve was inspected. There is no evidence of cork screw. The staple line appeared hemostatic. The CRNA inflated the ViSiGi to the green zone and the upper abdomen was flooded with saline. There were no bubbles. The sleeve was decompressed and the ViSiGi removed. My assistant scrubbed out and performed an upper endoscopy. The sleeve easily distended with air and the scope was easily advanced to the pylorus. There is no evidence of internal bleeding or cork screwing. There was no narrowing at the angularis. There is no evidence of bubbles. Please see his operative note for further details. The gastric sleeve was decompressed  and the endoscope was removed.  The greater curvature the stomach was grasped with a laparoscopic grasper and removed from the 15 mm trocar site.  The liver retractor was removed. I then closed the 15 mm trocar site with 2 interrupted 0 Vicryl sutures through the fascia using the endoclose. The closure was viewed laparoscopically and it was airtight. 70 cc of Exparel was then infiltrated in the preperitoneal spaces around the trocar sites. Pneumoperitoneum was released. All trocar sites were closed with a 4-0 Monocryl in a subcuticular fashion  followed by the application of Dermabond. The patient was extubated and taken to the recovery room in stable condition. All needle, instrument, and sponge counts were correct x2. There are no immediate complications  (2) 60 mm black with Seamguard (2) 60 mm green with Seamguard (1) 60 mm gold with seamguard (2) 60 mm blue with 1 seamguard  PLAN OF CARE: Admit to inpatient   PATIENT DISPOSITION:  PACU - hemodynamically stable.   Delay start of Pharmacological VTE agent (>24hrs) due to surgical blood loss or risk of bleeding:  no  Hudsyn Barich M. Andrey Campanile, MD, FAMary SellaGeneral, Bariatric, & Minimally Invasive Surgery [facility], [place]

## 2014-05-19 ENCOUNTER — Encounter (HOSPITAL_COMMUNITY): Payer: Self-pay | Admitting: General Surgery

## 2014-05-19 ENCOUNTER — Inpatient Hospital Stay (HOSPITAL_COMMUNITY): Payer: Managed Care, Other (non HMO)

## 2014-05-19 LAB — CBC WITH DIFFERENTIAL/PLATELET
Basophils Absolute: 0 10*3/uL (ref 0.0–0.1)
Basophils Relative: 0 % (ref 0–1)
EOS PCT: 0 % (ref 0–5)
Eosinophils Absolute: 0 10*3/uL (ref 0.0–0.7)
HEMATOCRIT: 37 % (ref 36.0–46.0)
HEMOGLOBIN: 11.9 g/dL — AB (ref 12.0–15.0)
LYMPHS ABS: 2.8 10*3/uL (ref 0.7–4.0)
LYMPHS PCT: 22 % (ref 12–46)
MCH: 27.7 pg (ref 26.0–34.0)
MCHC: 32.2 g/dL (ref 30.0–36.0)
MCV: 86.2 fL (ref 78.0–100.0)
Monocytes Absolute: 0.8 10*3/uL (ref 0.1–1.0)
Monocytes Relative: 6 % (ref 3–12)
NEUTROS ABS: 9.1 10*3/uL — AB (ref 1.7–7.7)
Neutrophils Relative %: 72 % (ref 43–77)
Platelets: 414 10*3/uL — ABNORMAL HIGH (ref 150–400)
RBC: 4.29 MIL/uL (ref 3.87–5.11)
RDW: 13.7 % (ref 11.5–15.5)
WBC: 12.7 10*3/uL — ABNORMAL HIGH (ref 4.0–10.5)

## 2014-05-19 LAB — COMPREHENSIVE METABOLIC PANEL
ALK PHOS: 71 U/L (ref 39–117)
ALT: 54 U/L — ABNORMAL HIGH (ref 0–35)
ANION GAP: 7 (ref 5–15)
AST: 35 U/L (ref 0–37)
Albumin: 3.6 g/dL (ref 3.5–5.2)
BUN: 6 mg/dL (ref 6–23)
CHLORIDE: 107 mmol/L (ref 96–112)
CO2: 26 mmol/L (ref 19–32)
Calcium: 8.9 mg/dL (ref 8.4–10.5)
Creatinine, Ser: 0.97 mg/dL (ref 0.50–1.10)
GFR calc Af Amer: 86 mL/min — ABNORMAL LOW (ref >90)
GFR calc non Af Amer: 74 mL/min — ABNORMAL LOW (ref >90)
Glucose, Bld: 109 mg/dL — ABNORMAL HIGH (ref 70–99)
POTASSIUM: 4 mmol/L (ref 3.5–5.1)
Sodium: 140 mmol/L (ref 135–145)
TOTAL PROTEIN: 6.9 g/dL (ref 6.0–8.3)
Total Bilirubin: 0.3 mg/dL (ref 0.3–1.2)

## 2014-05-19 LAB — HEMOGLOBIN AND HEMATOCRIT, BLOOD
HEMATOCRIT: 37.1 % (ref 36.0–46.0)
Hemoglobin: 12 g/dL (ref 12.0–15.0)

## 2014-05-19 MED ORDER — ALUM & MAG HYDROXIDE-SIMETH 200-200-20 MG/5ML PO SUSP: 15.0000 mL | Freq: Four times a day (QID) | ORAL | Status: DC | PRN

## 2014-05-19 MED ORDER — CLONAZEPAM 1 MG PO TABS: 1.0000 mg | ORAL_TABLET | Freq: Two times a day (BID) | ORAL | Status: DC | PRN

## 2014-05-19 MED ORDER — IOHEXOL 300 MG/ML  SOLN
50.0000 mL | Freq: Once | INTRAMUSCULAR | Status: AC | PRN
  Administered 2014-05-19: 50 mL via ORAL

## 2014-05-19 MED ORDER — LAMOTRIGINE 200 MG PO TABS
400.0000 mg | ORAL_TABLET | Freq: Every morning | ORAL | Status: DC
  Administered 2014-05-20: 400 mg via ORAL
  Filled 2014-05-19 (×2): qty 2

## 2014-05-19 MED ORDER — ONDANSETRON HCL 4 MG/2ML IJ SOLN
4.0000 mg | Freq: Four times a day (QID) | INTRAMUSCULAR | Status: DC
  Administered 2014-05-19 (×3): 4 mg via INTRAVENOUS
  Filled 2014-05-19 (×4): qty 2

## 2014-05-19 NOTE — Progress Notes (Signed)
Patient alert and oriented, Post op day 1.  Provided support and encouragement.  Encouraged pulmonary toilet, ambulation and small sips of liquids.  Patient complains of slight nausea, already taking zofran.  All questions answered.  Will continue to monitor.

## 2014-05-19 NOTE — Plan of Care (Signed)
Problem: Food- and Nutrition-Related Knowledge Deficit (NB-1.1) Goal: Nutrition education Formal process to instruct or train a patient/client in a skill or to impart knowledge to help patients/clients voluntarily manage or modify food choices and eating behavior to maintain or improve health.  Outcome: Completed/Met Date Met:  05/19/14 Nutrition Education Note  Received consult for diet education per DROP protocol.   Discussed 2 week post op diet with pt. Emphasized that liquids must be non carbonated, non caffeinated, and sugar free. Fluid goals discussed. Pt to follow up with outpatient bariatric RD for further diet progression after 2 weeks. Multivitamins and minerals also reviewed. Teach back method used, pt expressed understanding, expect good compliance.   Diet: First 2 Weeks  You will see the nutritionist about two (2) weeks after your surgery. The nutritionist will increase the types of foods you can eat if you are handling liquids well:  If you have severe vomiting or nausea and cannot handle clear liquids lasting longer than 1 day, call your surgeon  Protein Shake  Drink at least 2 ounces of shake 5-6 times per day  Each serving of protein shakes (usually 8 - 12 ounces) should have a minimum of:  15 grams of protein  And no more than 5 grams of carbohydrate  Goal for protein each day:  Men = 80 grams per day  Women = 60 grams per day  Protein powder may be added to fluids such as non-fat milk or Lactaid milk or Soy milk (limit to 35 grams added protein powder per serving)   Hydration  Slowly increase the amount of water and other clear liquids as tolerated (See Acceptable Fluids)  Slowly increase the amount of protein shake as tolerated  Sip fluids slowly and throughout the day  May use sugar substitutes in small amounts (no more than 6 - 8 packets per day; i.e. Splenda)   Fluid Goal  The first goal is to drink at least 8 ounces of protein shake/drink per day (or as directed  by the nutritionist); some examples of protein shakes are [employer] Johnson, AMR Corporation, EAS Edge HP, and Unjury. See handout from pre-op Bariatric Education Class:  Slowly increase the amount of protein shake you drink as tolerated  You may find it easier to slowly sip shakes throughout the day  It is important to get your proteins in first  Your fluid goal is to drink 64 - 100 ounces of fluid daily  It may take a few weeks to build up to this  32 oz (or more) should be clear liquids  And  32 oz (or more) should be full liquids (see below for examples)  Liquids should not contain sugar, caffeine, or carbonation   Clear Liquids:  Water or Sugar-free flavored water (i.e. Fruit H2O, Propel)  Decaffeinated coffee or tea (sugar-free)  Crystal Lite, Wyler's Lite, Minute Maid Lite  Sugar-free Jell-O  Bouillon or broth  Sugar-free Popsicle: *Less than 20 calories each; Limit 1 per day   Full Liquids:  Protein Shakes/Drinks + 2 choices per day of other full liquids  Full liquids must be:  No More Than 12 grams of Carbs per serving  No More Than 3 grams of Fat per serving  Strained low-fat cream soup  Non-Fat milk  Fat-free Lactaid Milk  Sugar-free yogurt (Dannon Lite & Fit, Greek yogurt)    Wynona Dove, MS Dietetic Intern Pager: [phone]

## 2014-05-19 NOTE — Care Management Note (Signed)
    Page 1 of 1   05/19/2014     12:26:37 PM CARE MANAGEMENT NOTE 05/19/2014  Patient:  [name],Meredith M   Account Number:  [id number]  Date Initiated:  05/19/2014  Documentation initiated by:  Lorenda IshiharaPEELE,Laiken Sandy  Subjective/Objective Assessment:   39 yo female admitted s/p sleeve gastrectomy. PTA lived at home with parent.     Action/Plan:   Home when stable   Anticipated DC Date:  05/22/2014   Anticipated DC Plan:  HOME/SELF CARE      DC Planning Services  CM consult      Choice offered to / List presented to:             Status of service:  Completed, signed off Medicare Important Message given?   (If response is "NO", the following Medicare IM given date fields will be blank) Date Medicare IM given:   Medicare IM given by:   Date Additional Medicare IM given:   Additional Medicare IM given by:    Discharge Disposition:  HOME/SELF CARE  Per UR Regulation:  Reviewed for med. necessity/level of care/duration of stay  If discussed at Long Length of Stay Meetings, dates discussed:    Comments:

## 2014-05-19 NOTE — Progress Notes (Signed)
1 Day Post-Op  Subjective: Fair amount of nausea. Pain controlled. Small amount of emesis  Objective: Vital signs in last 24 hours: Temp:  [97.5 F (36.4 C)-99.7 F (37.6 C)] 99.1 F (37.3 C) (03/08 0325) Pulse Rate:  [64-94] 87 (03/08 0325) Resp:  [12-18] 18 (03/08 0325) BP: (119-146)/(57-90) 129/57 mmHg (03/08 0325) SpO2:  [97 %-100 %] 97 % (03/08 0325) Last BM Date: 05/18/14  Intake/Output from previous day: 03/07 0701 - 03/08 0700 In: 3281.7 [I.V.:3281.7] Out: 1220 [Urine:1200; Blood:20] Intake/Output this shift:    Awake, alert, nontoxic cta b/l Reg Soft, incisions c/d/i; approp TTP,  No edema, +SCDs  Lab Results:   Recent Labs  05/18/14 1048 05/19/14 0541  WBC  --  12.7*  HGB 12.4 11.9*  HCT 37.7 37.0  PLT  --  414*   BMET  Recent Labs  05/19/14 0541  NA 140  K 4.0  CL 107  CO2 26  GLUCOSE 109*  BUN 6  CREATININE 0.97  CALCIUM 8.9   PT/INR No results for input(s): LABPROT, INR in the last 72 hours. ABG No results for input(s): PHART, HCO3 in the last 72 hours.  Invalid input(s): PCO2, PO2  Studies/Results: No results found.  Anti-infectives: Anti-infectives    Start     Dose/Rate Route Frequency Ordered Stop   05/18/14 0533  cefoTEtan in Dextrose 5% (CEFOTAN) IVPB 2 g     2 g Intravenous On call to O.R. 05/18/14 0533 05/18/14 0714      Assessment/Plan: s/p Procedure(s): LAPAROSCOPIC GASTRIC SLEEVE RESECTION WITH HIATAL HERNIA REPAIR (N/A) UPPER GI ENDOSCOPY (N/A)  No fever or tachy; clinically looks ok. Has expected nausea.  For UGI this am Will change zofran to scheduled; phenergan prn Hgb ok. Cont chemical VTE prophylaxis pulm toilet, IS, OOB, ambulate  Mary SellaEric M. Andrey CampanileWilson, MD, FACS General, Bariatric, & Minimally Invasive Surgery [facility], [place]   LOS: 1 day    Atilano InaWILSON,Vidalia Serpas M 05/19/2014

## 2014-05-20 DIAGNOSIS — Z9884 Bariatric surgery status: Secondary | ICD-10-CM

## 2014-05-20 DIAGNOSIS — K449 Diaphragmatic hernia without obstruction or gangrene: Secondary | ICD-10-CM

## 2014-05-20 DIAGNOSIS — F319 Bipolar disorder, unspecified: Secondary | ICD-10-CM | POA: Diagnosis present

## 2014-05-20 DIAGNOSIS — K219 Gastro-esophageal reflux disease without esophagitis: Secondary | ICD-10-CM | POA: Diagnosis present

## 2014-05-20 DIAGNOSIS — M170 Bilateral primary osteoarthritis of knee: Secondary | ICD-10-CM | POA: Diagnosis present

## 2014-05-20 LAB — CBC WITH DIFFERENTIAL/PLATELET
BASOS ABS: 0 10*3/uL (ref 0.0–0.1)
Basophils Relative: 0 % (ref 0–1)
EOS PCT: 2 % (ref 0–5)
Eosinophils Absolute: 0.2 10*3/uL (ref 0.0–0.7)
HCT: 38.1 % (ref 36.0–46.0)
Hemoglobin: 12.2 g/dL (ref 12.0–15.0)
LYMPHS PCT: 27 % (ref 12–46)
Lymphs Abs: 2.7 10*3/uL (ref 0.7–4.0)
MCH: 27.8 pg (ref 26.0–34.0)
MCHC: 32 g/dL (ref 30.0–36.0)
MCV: 86.8 fL (ref 78.0–100.0)
MONOS PCT: 7 % (ref 3–12)
Monocytes Absolute: 0.7 10*3/uL (ref 0.1–1.0)
NEUTROS ABS: 6.3 10*3/uL (ref 1.7–7.7)
Neutrophils Relative %: 64 % (ref 43–77)
PLATELETS: 405 10*3/uL — AB (ref 150–400)
RBC: 4.39 MIL/uL (ref 3.87–5.11)
RDW: 13.6 % (ref 11.5–15.5)
WBC: 9.8 10*3/uL (ref 4.0–10.5)

## 2014-05-20 MED ORDER — ONDANSETRON 4 MG PO TBDP: 4.0000 mg | ORAL_TABLET | Freq: Three times a day (TID) | ORAL | Status: DC | PRN

## 2014-05-20 MED ORDER — OXYCODONE HCL 5 MG/5ML PO SOLN: 5.0000 mg | ORAL | Status: DC | PRN

## 2014-05-20 NOTE — Progress Notes (Signed)
Discharge instructions questions given.  Questions answsered

## 2014-05-20 NOTE — Progress Notes (Signed)
Patient alert and oriented, pain is controlled. Patient is tolerating fluids,  advanced to protein shake today, patient tolerated well.  Reviewed Gastric sleeve discharge instructions with patient and patient is able to articulate understanding.  Provided information on BELT program, Support Group and [facility] outpatient pharmacy. All questions answered, will continue to monitor.  

## 2014-05-20 NOTE — Discharge Instructions (Signed)

## 2014-05-20 NOTE — Progress Notes (Signed)
2 Days Post-Op  Subjective: Less to no nausea. Pain ok. Tolerated water. No more emesis/spitup.   Objective: Vital signs in last 24 hours: Temp:  [98.5 F (36.9 C)-99 F (37.2 C)] 98.7 F (37.1 C) (03/09 0544) Pulse Rate:  [63-79] 79 (03/09 0544) Resp:  [16-18] 18 (03/09 0544) BP: (112-163)/(57-90) 132/83 mmHg (03/09 0544) SpO2:  [92 %-100 %] 92 % (03/09 0544) Last BM Date: 05/18/14  Intake/Output from previous day: 03/08 0701 - 03/09 0700 In: 2758.3 [P.O.:360; I.V.:2398.3] Out: 1600 [Urine:1600] Intake/Output this shift:    Alert, nad, smiling cta b/l Reg Soft, expected mild TTP. Incision c/d/i No edema  Lab Results:   Recent Labs  05/19/14 0541 05/19/14 1700 05/20/14 0529  WBC 12.7*  --  9.8  HGB 11.9* 12.0 12.2  HCT 37.0 37.1 38.1  PLT 414*  --  405*   BMET  Recent Labs  05/19/14 0541  NA 140  K 4.0  CL 107  CO2 26  GLUCOSE 109*  BUN 6  CREATININE 0.97  CALCIUM 8.9   PT/INR No results for input(s): LABPROT, INR in the last 72 hours. ABG No results for input(s): PHART, HCO3 in the last 72 hours.  Invalid input(s): PCO2, PO2  Studies/Results: Dg Ugi W/water Sol Cm  05/19/2014   CLINICAL DATA:  Status post gastric sleeve.  EXAM: WATER SOLUBLE UPPER GI SERIES  TECHNIQUE: Single-column upper GI series was performed using water soluble contrast.  CONTRAST:  50mL OMNIPAQUE IOHEXOL 300 MG/ML  SOLN  COMPARISON:  Upper GI of 12/10/2013  FLUOROSCOPY TIME:  If the device does not provide the exposure index:  Fluoroscopy Time (in minutes and seconds):  1 minutes and 52 seconds  Number of Acquired Images:  Zero  FINDINGS: Preprocedure scout film is unremarkable.  Focus single-contrast study demonstrates mild contrast stasis in the lower esophagus, likely related to a component of postoperative atonly and possibly esophageal dysmotility.  Normal appearance of the stomach, status post gastric sleeve procedure. No evidence of contrast extravasation or bowel  obstruction.  IMPRESSION: Expected appearance after gastric sleeve, without obstruction or extravasation.   Electronically Signed   By: Jeronimo GreavesKyle  Talbot M.D.   On: 05/19/2014 10:53    Anti-infectives: Anti-infectives    Start     Dose/Rate Route Frequency Ordered Stop   05/18/14 0533  cefoTEtan in Dextrose 5% (CEFOTAN) IVPB 2 g     2 g Intravenous On call to O.R. 05/18/14 0533 05/18/14 0714      Assessment/Plan: s/p Procedure(s): LAPAROSCOPIC GASTRIC SLEEVE RESECTION WITH HIATAL HERNIA REPAIR (N/A) UPPER GI ENDOSCOPY (N/A) Looks good. No fever or tachycardia.  Adv to POD 2 Probable dc later today - will need to check on po intake Discussed dc instructions  Mary SellaEric M. Andrey CampanileWilson, MD, FACS General, Bariatric, & Minimally Invasive Surgery [facility], [place]   LOS: 2 days    Atilano InaWILSON,Donice Alperin M 05/20/2014

## 2014-05-20 NOTE — Discharge Summary (Signed)
Physician Discharge Summary  Meredith Cochran ZOX:[id number] DOB: March 16, 1975 DOA: 05/18/2014  PCP: Domenic SchwabLindley,Cheryl Paulette, FNP  Admit date: 05/18/2014 Discharge date: 05/20/2014  Recommendations for Outpatient Follow-up:   Follow-up Information    Follow up with Atilano InaWILSON,Araly Kaas M, MD On 06/04/2014.   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 Please see operative note. Preoperatively the patient was given 5000 units of subcutaneous heparin for DVT prophylaxis. Postoperative prophylactic Lovenox dosing was started on the morning of postoperative day 1. The patient underwent an upper GI on postoperative day 1 which demonstrated no extravasation of contrast and emptying of the contrast into the small bowel. The patient was started on ice chips and water which they tolerated. On postoperative day 2 The patient's diet was advanced to protein shakes which they also tolerated. The patient was ambulating without difficulty. Their vital signs are stable without fever or tachycardia. Their hemoglobin had remained stable.  The patient had received discharge instructions and counseling. They were deemed stable for  discharge.   Discharge Instructions  Discharge Instructions    Ambulate hourly while awake    Complete by:  As directed      Call MD for:  difficulty breathing, headache or visual disturbances    Complete by:  As directed      Call MD for:  persistant dizziness or light-headedness    Complete by:  As directed      Call MD for:  persistant nausea and vomiting    Complete by:  As directed      Call MD for:  redness, tenderness, or signs of infection (pain, swelling, redness, odor or green/yellow discharge around incision site)    Complete by:  As directed      Call MD for:  severe uncontrolled pain    Complete by:  As directed      Call MD for:  temperature >101 F    Complete by:  As directed      Diet bariatric full liquid    Complete by:  As directed      Discharge instructions    Complete by:  As directed   See bariatric  discharge instructions     Incentive spirometry    Complete by:  As directed   Perform hourly while awake            Medication List    STOP taking these medications        ibuprofen 200 MG tablet  Commonly known as:  ADVIL,MOTRIN     SAPHRIS 10 MG Subl  Generic drug:  Asenapine Maleate      TAKE these medications        ADDERALL 15 MG tablet  Generic drug:  amphetamine-dextroamphetamine  Take 15 mg by mouth every morning.     clonazePAM 1 MG tablet  Commonly known as:  KLONOPIN  Take 1 mg by mouth 2 (two) times daily as needed for anxiety.     lamoTRIgine 200 MG tablet  Commonly known as:  LAMICTAL  Take 400 mg by mouth every morning.     MULTIVITAMIN & MINERAL PO  Take 1 tablet by mouth 4 (four) times daily. Beriatric (Fusion) chewable.     ondansetron 4 MG disintegrating tablet  Commonly known as:  ZOFRAN ODT  Take 1 tablet (4 mg total) by mouth every 8 (eight) hours as needed for nausea or vomiting.     oxyCODONE 5 MG/5ML solution  Commonly known as:  ROXICODONE  Take 5-10 mLs (5-10 mg total) by mouth every 4 (four) hours as  needed for moderate pain or severe pain.     pantoprazole 40 MG tablet  Commonly known as:  PROTONIX  Take 40 mg by mouth every morning.     Vitamin D (Ergocalciferol) 50000 UNITS Caps capsule  Commonly known as:  DRISDOL  Take 50,000 Units by mouth every 7 (seven) days. Monday           Follow-up Information    Follow up with Atilano Ina, MD On 06/04/2014.   Specialty:  General Surgery   Why:  10:15 AM (arrive 10 AM), For wound re-check   Contact information:   [address] [place] [address] [phone]        The results of significant diagnostics from this hospitalization (including imaging, microbiology, ancillary and laboratory) are listed below for reference.    Significant Diagnostic Studies: Dg Ugi W/water Sol Cm  05/19/2014   CLINICAL DATA:  Status post gastric sleeve.  EXAM: WATER SOLUBLE UPPER GI SERIES  TECHNIQUE: Single-column upper GI series was performed using water soluble contrast.  CONTRAST:  50mL OMNIPAQUE IOHEXOL 300 MG/ML  SOLN  COMPARISON:  Upper GI of 12/10/2013  FLUOROSCOPY TIME:  If the device does not provide the exposure index:  Fluoroscopy Time (in minutes and seconds):  1 minutes and 52 seconds  Number of Acquired Images:  Zero  FINDINGS: Preprocedure scout film is unremarkable.  Focus single-contrast study demonstrates mild contrast stasis in the lower esophagus, likely related to a component of postoperative atonly and possibly esophageal dysmotility.  Normal appearance of the stomach, status post gastric sleeve procedure. No evidence of contrast extravasation or bowel obstruction.  IMPRESSION: Expected appearance after gastric sleeve, without obstruction or extravasation.   Electronically Signed   By: Jeronimo Greaves M.D.   On: 05/19/2014 10:53    Labs: Basic Metabolic Panel:  Recent Labs Lab 05/19/14 0541  NA 140  K 4.0  CL 107  CO2 26  GLUCOSE 109*  BUN 6  CREATININE 0.97  CALCIUM 8.9   Liver Function Tests:  Recent  Labs Lab 05/19/14 0541  AST 35  ALT 54*  ALKPHOS 71  BILITOT 0.3  PROT 6.9  ALBUMIN 3.6    CBC:  Recent Labs Lab 05/18/14 1048 05/19/14 0541 05/19/14 1700 05/20/14 0529  WBC  --  12.7*  --  9.8  NEUTROABS  --  9.1*  --  6.3  HGB 12.4 11.9* 12.0 12.2  HCT 37.7 37.0 37.1 38.1  MCV  --  86.2  --  86.8  PLT  --  414*  --  405*    CBG: No results for input(s): GLUCAP in  the last 168 hours.  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

## 2014-05-22 ENCOUNTER — Telehealth (HOSPITAL_COMMUNITY): Payer: Self-pay

## 2014-05-22 NOTE — Telephone Encounter (Signed)
Made discharge phone call to patient per DROP protocol. Asking the following questions.    1. Do you have someone to care for you now that you are home?  yes 2. Are you having pain now that is not relieved by your pain medication?  no 3. Are you able to drink the recommended daily amount of fluids (48 ounces minimum/day) and protein (60-80 grams/day) as prescribed by the dietitian or nutritional counselor?  Yes, today I got a whole 30 gram shake already 4. Are you taking the vitamins and minerals as prescribed?  haven't started yet 5. Do you have the "on call" number to contact your surgeon if you have a problem or question?  yes 6. Are your incisions free of redness, swelling or drainage? (If steri strips, address that these can fall off, shower as tolerated) no 7. Have your bowels moved since your surgery?  If not, are you passing gas?  No, yes 8. Are you up and walking 3-4 times per day?  yes    1. Do you have an appointment made to see your surgeon in the next month?  yes 2. Were you provided your discharge medications before your surgery or before you were discharged from the hospital and are you taking them without problem?  yes 3. Were you provided phone numbers to the clinic/surgeon's office?  yes 4. Did you watch the patient education video module in the (clinic, surgeon's office, etc.) before your surgery? yes 5. Do you have a discharge checklist that was provided to you in the hospital to reference with instructions on how to take care of yourself after surgery?  yes 6. Did you see a dietitian or nutritional counselor while you were in the hospital?  yes 7. Do you have an appointment to see a dietitian or nutritional counselor in the next month? yes

## 2014-06-02 ENCOUNTER — Encounter: Payer: 59 | Attending: General Surgery

## 2014-06-02 VITALS — Ht 68.0 in | Wt 249.5 lb

## 2014-06-02 DIAGNOSIS — Z6841 Body Mass Index (BMI) 40.0 and over, adult: Secondary | ICD-10-CM | POA: Insufficient documentation

## 2014-06-02 DIAGNOSIS — Z713 Dietary counseling and surveillance: Secondary | ICD-10-CM | POA: Insufficient documentation

## 2014-06-02 NOTE — Progress Notes (Signed)
Bariatric Class:  Appt start time: 1530 end time:  1630.  2 Week Post-Operative Nutrition Class  Patient was seen on 06/02/14 for Post-[facility]ative Nutrition [facility].  Surgery date: 05/18/14 Surgery type: Gastric sleeve Start weight at [facility]: 299 lbs on 01/06/14 Weight today: 249.5 lbs  Weight change: 30 lbs  TANITA  BODY COMP RESULTS  04/27/14 06/02/14   BMI (kg/m^2) 40.5 37.9   Fat Mass (lbs) 134 122.0   Fat Free Mass (lbs) 132.5 127.5   Total Body Water (lbs) 97 93.5    The following the learning objectives were met by the patient during this course:  Identifies Phase 3A (Soft, High Proteins) Dietary Goals and will begin from 2 weeks post-operatively to 2 months post-operatively  Identifies appropriate sources of fluids and proteins   States protein recommendations and appropriate sources post-operatively  Identifies the need for appropriate texture modifications, mastication, and bite sizes when consuming solids  Identifies appropriate multivitamin and calcium sources post-operatively  Describes the need for physical activity post-operatively and will follow MD recommendations  States when to call healthcare provider regarding medication questions or post-operative complications  Handouts given during class include:  Phase 3A: Soft, High Protein Diet Handout  Follow-Up Plan: Patient will follow-up at [facility] in 6 weeks for 2 month post-op nutrition visit for diet advancement per MD.

## 2014-06-16 ENCOUNTER — Ambulatory Visit: Payer: BC Managed Care – PPO | Admitting: Dietician

## 2014-07-14 ENCOUNTER — Ambulatory Visit: Payer: BC Managed Care – PPO | Admitting: Dietician

## 2014-07-14 ENCOUNTER — Encounter: Payer: 59 | Attending: General Surgery | Admitting: Dietician

## 2014-07-14 ENCOUNTER — Encounter: Payer: Self-pay | Admitting: Dietician

## 2014-07-14 VITALS — Ht 68.0 in | Wt 237.5 lb

## 2014-07-14 DIAGNOSIS — Z6841 Body Mass Index (BMI) 40.0 and over, adult: Secondary | ICD-10-CM | POA: Insufficient documentation

## 2014-07-14 DIAGNOSIS — Z713 Dietary counseling and surveillance: Secondary | ICD-10-CM | POA: Diagnosis not present

## 2014-07-14 NOTE — Progress Notes (Signed)
  Follow-up visit:  8 Weeks Post-Operative Sleeve Gastrectomy Surgery  Medical Nutrition Therapy:  Appt start time: 1015 end time:  1040.  Primary concerns today: Post-operative Bariatric Surgery Nutrition Management. Returns with a 12 lbs weight loss. Has been working a lot and has not been exercising. Tolerating foods ok and can tell if she doesn't chew well because it hurts. Stops eats when she is full.   Surgery date: 05/18/14 Surgery type: Gastric sleeve [facility] lbs on 01/06/14 Weight today:237.5 lbs  Weight change: 12 lbs Total weight loss: 61.5 lbs  TANITA  BODY COMP RESULTS  04/27/14 06/02/14 07/14/14   BMI (kg/m^2) 40.5 37.9 36.1   Fat Mass (lbs) 134 122.0 110.0   Fat Free Mass (lbs) 132.5 127.5 127.5   Total Body Water (lbs) 97 93.5 93.5    Preferred Learning Style:   No preference indicated   Learning Readiness:   Ready  24-hr recall: B (AM): Premier protein shake or 2 boiled eggs or bacon or  1/4 cup cheese (5-30 g) Snk (AM): Nectar protein or cheese stick (5-23 g) L (PM): bacon or Malaw[place]key meat with cheese or 2 oz of tuna with egg or 1/4 cup beans with pepper jack (6-14 g) Snk (PM): Nectar protein or cheese stick (5-23 g) D (PM): 6 meatballs or 3-4 oz chicken breast or 3 oz tilapia or small chili (14-28 g) Snk (PM): sometimes cheese (0-6 g)  Fluid intake: 8-11 oz protein shake, 48 oz water, propel, 10 oz decaf coffee (usually over 64 oz) Estimated total protein intake: protein shake about 3 x week, usually averaging 60-65 g  Medications: see list  Supplementation: taking   Using straws: sometimes Drinking while eating: No Hair loss: No Carbonated beverages: No N/V/D/C: sometimes has constipation but she can manage it Dumping syndrome: No  Recent physical activity:  None in the past few weeks   Progress Towards Goal(s):  In progress.  Handouts given during visit include:  High Protein + Non Starchy Vegetables    Nutritional Diagnosis:   [place]-3.3 Overweight/obesity related to past poor dietary habits and physical inactivity as evidenced by patient w/ recent sleeve gastrectomy surgery following dietary guidelines for continued weight loss.    Intervention:  Nutrition education/diet advancement. Goals:  Follow Phase 3A: Soft High Protein Phase  Eat 3-6 small meals/snacks, every 3-5 hrs  Increase lean protein foods to meet 60g goal  Increase fluid intake to 64oz +  Avoid drinking 15 minutes before, during and 30 minutes after eating  Aim for >30 min of physical activity daily per MD  Try walking first thing in the morning  Try limiting the flavored creamer to save calories  Teaching Method Utilized:  Visual Auditory Hands on  Barriers to learning/adherence to lifestyle change: busy work schedule  Demonstrated degree of understanding via:  Teach Back   Monitoring/Evaluation:  Dietary intake, exercise, and body weight. Follow up in 1 months for 3 month post-op visit.

## 2014-07-14 NOTE — Patient Instructions (Addendum)
Goals:  Follow Phase 3A: Soft High Protein Phase  Eat 3-6 small meals/snacks, every 3-5 hrs  Increase lean protein foods to meet 60g goal  Increase fluid intake to 64oz +  Avoid drinking 15 minutes before, during and 30 minutes after eating  Aim for >30 min of physical activity daily per MD  Try walking first thing in the morning  Try limiting the flavored creamer to save calories  Surgery date: 05/18/14 Surgery type: Gastric sleeve [facility] lbs on 01/06/14 Weight today:237.5 lbs  Weight change: 12 lbs Total weight loss: 61.5 lbs  TANITA  BODY COMP RESULTS  04/27/14 06/02/14 07/14/14   BMI (kg/m^2) 40.5 37.9 36.1   Fat Mass (lbs) 134 122.0 110.0   Fat Free Mass (lbs) 132.5 127.5 127.5   Total Body Water (lbs) 97 93.5 93.5

## 2014-08-18 ENCOUNTER — Encounter: Payer: Managed Care, Other (non HMO) | Attending: General Surgery | Admitting: Dietician

## 2014-08-18 ENCOUNTER — Encounter: Payer: Self-pay | Admitting: Dietician

## 2014-08-18 VITALS — Ht 68.0 in | Wt 229.0 lb

## 2014-08-18 DIAGNOSIS — Z6841 Body Mass Index (BMI) 40.0 and over, adult: Secondary | ICD-10-CM | POA: Diagnosis not present

## 2014-08-18 DIAGNOSIS — Z713 Dietary counseling and surveillance: Secondary | ICD-10-CM | POA: Insufficient documentation

## 2014-08-18 NOTE — Patient Instructions (Addendum)
Goals: Follow Phase 3B: High Protein + Non-Starchy Vegetables Eat 3-6 small meals/snacks, every 3-5 hrs Increase lean protein foods to meet 60g goal Increase fluid intake to 64oz + Avoid drinking 15 minutes before, during and 30 minutes after eating Aim for >30 min of physical activity daily Avoid having sweets/tempting foods at home Think about joining an online support group Try to not eat and work at the same time if possible  Surgery date: 05/18/14 Surgery type: Gastric sleeve [facility]: [facility] lbs on 01/06/14 Weight today: 229 lbs  Weight change: 8.5 lbs Total weight loss: 70 lbs  TANITA  BODY COMP RESULTS  04/27/14 06/02/14 07/14/14 08/18/14   BMI (kg/m^2) 40.5 37.9 36.1 34.8   Fat Mass (lbs) 134 122.0 110.0 100.5   Fat Free Mass (lbs) 132.5 127.5 127.5 128.5   Total Body Water (lbs) 97 93.5 93.5 94.0

## 2014-08-18 NOTE — Progress Notes (Signed)
  Follow-up visit:  12 Weeks Post-Operative Sleeve Gastrectomy Surgery  Medical Nutrition Therapy:  Appt start time: 1030 end time:  1100.  Primary concerns today: Post-operative Bariatric Surgery Nutrition Management. Returns with an 8.5 lbs weight loss. Has been "giving in to temptations" such as brownies even though they upset her stomach. Feels like she may be eating out of boredom. May need to drink more water. Feels like she should have lost more weight. Has started at the gym.   Works a lot of overtime.   Surgery date: 05/18/14 Surgery type: Gastric sleeve [facility] lbs on 01/06/14 Weight today: 229 lbs  Weight change: 8.5 lbs Total weight loss: 70 lbs  TANITA  BODY COMP RESULTS  04/27/14 06/02/14 07/14/14 08/18/14   BMI (kg/m^2) 40.5 37.9 36.1 34.8   Fat Mass (lbs) 134 122.0 110.0 100.5   Fat Free Mass (lbs) 132.5 127.5 127.5 128.5   Total Body Water (lbs) 97 93.5 93.5 94.0    Preferred Learning Style:   No preference indicated   Learning Readiness:   Ready  24-hr recall: B (AM): Premier protein shake or 2 boiled eggs or bacon or  1/4 cup cheese (5-30 g) Snk (AM): Nectar protein or cheese stick (5-23 g) L (PM): 3 oz total:  bacon or [place] meat with cheese or 2 oz of tuna with egg or 1/4 cup beans with pepper jack (21 g) Snk (PM): sporadic, 1/2 almonds D (PM): 6 meatballs or 3-4 oz chicken breast, hamburger or 3 oz tilapia or small chili (21-28 g) Snk (PM): nothing or "indulging"  Fluid intake: 8-11 oz protein shake, 32-64 oz water, propel, 8 oz decaf coffee (not always getting in) Estimated total protein intake: protein shake about 3 x week, usually averaging 60-65 g  Medications: see list  Supplementation: taking   Using straws: sometimes Drinking while eating: No Hair loss: No Carbonated beverages: No N/V/D/C: sometimes has constipation and had some diarrhea one time after having hush puppies  Dumping syndrome: one time hush puppies  Recent  physical activity:  3 x week at the gym 30 minutes of cardio and will be starting weights  Progress Towards Goal(s):  In progress.  Handouts given during visit include:  High Protein + Non Starchy Vegetables    Nutritional Diagnosis:  [place]-3.3 Overweight/obesity related to past poor dietary habits and physical inactivity as evidenced by patient w/ recent sleeve gastrectomy surgery following dietary guidelines for continued weight loss.    Intervention:  Nutrition education/diet advancement. Goals: Follow Phase 3B: High Protein + Non-Starchy Vegetables Eat 3-6 small meals/snacks, every 3-5 hrs Increase lean protein foods to meet 60g goal Increase fluid intake to 64oz + Avoid drinking 15 minutes before, during and 30 minutes after eating Aim for >30 min of physical activity daily Avoid having sweets/tempting foods at home Think about joining an online support group Try to not eat and work at the same time if possible  Teaching Method Utilized:  Scientific laboratory technicianVisual Auditory Hands on  Barriers to learning/adherence to lifestyle change: busy work schedule  Demonstrated degree of understanding via:  Teach Back   Monitoring/Evaluation:  Dietary intake, exercise, and body weight. Follow up in 6 weeks for 4.5 month post-op visit.

## 2014-08-25 ENCOUNTER — Encounter (HOSPITAL_COMMUNITY): Payer: Self-pay | Admitting: *Deleted

## 2014-09-29 ENCOUNTER — Encounter: Payer: Managed Care, Other (non HMO) | Attending: General Surgery | Admitting: Dietician

## 2014-09-29 ENCOUNTER — Encounter: Payer: Self-pay | Admitting: Dietician

## 2014-09-29 VITALS — Ht 68.0 in | Wt 222.5 lb

## 2014-09-29 DIAGNOSIS — Z713 Dietary counseling and surveillance: Secondary | ICD-10-CM | POA: Insufficient documentation

## 2014-09-29 DIAGNOSIS — Z6841 Body Mass Index (BMI) 40.0 and over, adult: Secondary | ICD-10-CM | POA: Diagnosis not present

## 2014-09-29 NOTE — Patient Instructions (Addendum)
Goals: Follow Phase 3B: High Protein + Non-Starchy Vegetables Eat 3-6 small meals/snacks, every 3-5 hrs Increase lean protein foods to meet 60g goal Increase fluid intake to 64oz + Avoid drinking 15 minutes before, during and 30 minutes after eating Aim for >30 min of physical activity daily Stop buying watermelon Think about joining an online support group Make sure you are hungry in your stomach if you have a snack (instead of eating out of boredom) Think about stopping what you are doing if you are going to have a snack  Surgery date: 05/18/14 Surgery type: Gastric sleeve [facility] lbs on 01/06/14 Weight today: 222.5 lbs  Weight change: 6.5 lbs Total weight loss: 76.5 lbs  TANITA  BODY COMP RESULTS  04/27/14 06/02/14 07/14/14 08/18/14 09/29/14   BMI (kg/m^2) 40.5 37.9 36.1 34.8 33.8   Fat Mass (lbs) 134 122.0 110.0 100.5 99.0   Fat Free Mass (lbs) 132.5 127.5 127.5 128.5 123.5   Total Body Water (lbs) 97 93.5 93.5 94.0 90.5

## 2014-09-29 NOTE — Progress Notes (Signed)
  Follow-up visit:  4.5 Months Post-Operative Sleeve Gastrectomy Surgery  Medical Nutrition Therapy:  Appt start time: 920 end time:  945.  Primary concerns today: Post-operative Bariatric Surgery Nutrition Management. Returns with an 6.5 lbs weight loss. Had not gone to the gym for 4 weeks d/t vacations and busy schedule. Started back last week. Is "addicted" to watermelon. Eating it 3 x day. Bought 4 full sized watermelons last week and most of it is gone. No longer have brownies or feeling like she is eating out of boredom.  Still works a lot of overtime.   Surgery date: 05/18/14 Surgery type: [facility] lbs on 01/06/14 Weight today: 222.5 lbs  Weight change: 6.5 lbs Total weight loss: 76.5 lbs  Weight loss goal: 175-180 lbs  TANITA  BODY COMP RESULTS  04/27/14 06/02/14 07/14/14 08/18/14 09/29/14   BMI (kg/m^2) 40.5 37.9 36.1 34.8 33.8   Fat Mass (lbs) 134 122.0 110.0 100.5 99.0   Fat Free Mass (lbs) 132.5 127.5 127.5 128.5 123.5   Total Body Water (lbs) 97 93.5 93.5 94.0 90.5    Preferred Learning Style:   No preference indicated   Learning Readiness:   Ready  24-hr recall: B (AM): watermelon  Snk (AM): Premier protein (30 g) L (PM): 3 oz total:  bacon or [place] meat with cheese or 2 oz of tuna with egg or 1/4 cup beans with pepper jack with watermelon (21 g) Snk (PM): 2 boiled eggs (12 g) D (PM): 6 meatballs with green beans or pork chops with vegetable (3-4 oz protein) (21-28 g) Snk (PM): watermelon  Fluid intake: 11 oz protein shake, 50 oz water, 8 oz decaf coffee (69 oz) Estimated total protein intake: protein shake about 3 x week, usually averaging 84-91 g  Medications: see list  Supplementation: taking   Using straws: No Drinking while eating: No Hair loss: No Carbonated beverages: No N/V/D/C: No Dumping syndrome: No  Recent physical activity:  3 x week at the gym 30 minutes of cardio and will be starting weights  Progress  Towards Goal(s):  In progress.  Handouts given during visit include:  High Protein + Non Starchy Vegetables    Nutritional Diagnosis:  [place]-3.3 Overweight/obesity related to past poor dietary habits and physical inactivity as evidenced by patient w/ recent sleeve gastrectomy surgery following dietary guidelines for continued weight loss.    Intervention:  Nutrition education/diet advancement. Goals: Follow Phase 3B: High Protein + Non-Starchy Vegetables Eat 3-6 small meals/snacks, every 3-5 hrs Increase lean protein foods to meet 60g goal Increase fluid intake to 64oz + Avoid drinking 15 minutes before, during and 30 minutes after eating Aim for >30 min of physical activity daily Stop buying watermelon Think about joining an online support group Make sure you are hungry in your stomach if you have a snack (instead of eating out of boredom) Think about stopping what you are doing if you are going to have a snack  Teaching Method Utilized:  Visual Auditory Hands on  Barriers to learning/adherence to lifestyle change: busy work schedule  Demonstrated degree of understanding via:  Teach Back   Monitoring/Evaluation:  Dietary intake, exercise, and body weight. Follow up in 4 weeks for 5.5 month post-op visit.

## 2014-11-03 ENCOUNTER — Encounter: Payer: Managed Care, Other (non HMO) | Attending: General Surgery | Admitting: Dietician

## 2014-11-03 ENCOUNTER — Encounter: Payer: Self-pay | Admitting: Dietician

## 2014-11-03 VITALS — Ht 68.0 in | Wt 218.5 lb

## 2014-11-03 DIAGNOSIS — Z6841 Body Mass Index (BMI) 40.0 and over, adult: Secondary | ICD-10-CM

## 2014-11-03 DIAGNOSIS — Z713 Dietary counseling and surveillance: Secondary | ICD-10-CM | POA: Insufficient documentation

## 2014-11-03 NOTE — Patient Instructions (Addendum)
Goals: Follow Phase 3B: High Protein + Non-Starchy Vegetables Eat 3-6 small meals/snacks, every 3-5 hrs Increase lean protein foods to meet 60g goal Increase fluid intake to 64oz + Avoid drinking 15 minutes before, during and 30 minutes after eating Aim for >30 min of physical activity daily Continue to make sure you are hungry in your stomach if you have a snack (instead of eating out of boredom) Think about stopping what you are doing if you are going to have a snack Start taking vitamins/calcium. Set reminder alarm  Surgery date: 05/18/14 Surgery type: Gastric sleeve Start weight at [facility]: 299 lbs on 01/06/14 Weight today: 219.5 lbs  Weight change: 3 lbs Total weight loss: 79.5 lbs Weight loss goal: 175-180 lbs  TANITA  BODY COMP RESULTS  04/27/14 06/02/14 07/14/14 08/18/14 09/29/14 11/03/14   BMI (kg/m^2) 40.5 37.9 36.1 34.8 33.8 33.2   Fat Mass (lbs) 134 122.0 110.0 100.5 99.0 94.5   Fat Free Mass (lbs) 132.5 127.5 127.5 128.5 123.5 124.0   Total Body Water (lbs) 97 93.5 93.5 94.0 90.5 91.0

## 2014-11-03 NOTE — Progress Notes (Signed)
  Follow-up visit: 6 Months Post-Operative Sleeve Gastrectomy Surgery  Medical Nutrition Therapy:  Appt start time: 915 end time:  935.  Primary concerns today: Post-operative Bariatric Surgery Nutrition Management. Returns with an 3 lbs weight loss. Not having anymore watermelon. Has been on vacation and will be back to overtime. Not eating out of boredom, eating when she is hungry instead.   Surgery date: 05/18/14 Surgery type: Gastric sleeve Start weight at [facility]: 299 lbs on 01/06/14 Weight today: 219.5 lbs  Weight change: 3 lbs Total weight loss: 79.5 lbs Weight loss goal: 175-180 lbs  TANITA  BODY COMP RESULTS  04/27/14 06/02/14 07/14/14 08/18/14 09/29/14 11/03/14   BMI (kg/m^2) 40.5 37.9 36.1 34.8 33.8 33.2   Fat Mass (lbs) 134 122.0 110.0 100.5 99.0 94.5   Fat Free Mass (lbs) 132.5 127.5 127.5 128.5 123.5 124.0   Total Body Water (lbs) 97 93.5 93.5 94.0 90.5 91.0    Preferred Learning Style:   No preference indicated   Learning Readiness:   Ready  24-hr recall: B (AM): [place] yogurt (12 g) Snk (AM): Premier protein (30 g) L (PM): 3 oz total:  bacon or [place] meat with cheese or 2 oz of tuna with egg or 1/4 cup beans with pepper jack (21 g) Snk (PM): cheese stick or almonds (6 g) D (PM): 6 meatballs with green beans or pork chops with vegetable (3-4 oz protein) (21-28 g) Snk (PM): none  Fluid intake: 11 oz protein shake, 50 oz water, 8 oz decaf coffee (69 oz) Estimated total protein intake: protein shake about 3 x week, usually averaging 84-91 g  Medications: see list  Supplementation: not taking as much as she should   Using straws: No Drinking while eating: No Hair loss: No Carbonated beverages: No N/V/D/C: No Dumping syndrome: No  Recent physical activity:  3 x week at the gym cardio and weights and abs class  Progress Towards Goal(s):  In progress.  Handouts given during visit include:  High Protein + Non Starchy Vegetables    Nutritional Diagnosis:  [place]-3.3  Overweight/obesity related to past poor dietary habits and physical inactivity as evidenced by patient w/ recent sleeve gastrectomy surgery following dietary guidelines for continued weight loss.    Intervention:  Nutrition education/diet reinforcement Goals: Follow Phase 3B: High Protein + Non-Starchy Vegetables Eat 3-6 small meals/snacks, every 3-5 hrs Increase lean protein foods to meet 60g goal Increase fluid intake to 64oz + Avoid drinking 15 minutes before, during and 30 minutes after eating Aim for >30 min of physical activity daily Continue to make sure you are hungry in your stomach if you have a snack (instead of eating out of boredom) Think about stopping what you are doing if you are going to have a snack Start taking vitamins/calcium. Set reminder alarm  Teaching Method Utilized:  Visual Auditory Hands on  Barriers to learning/adherence to lifestyle change: busy work schedule  Demonstrated degree of understanding via:  Teach Back   Monitoring/Evaluation:  Dietary intake, exercise, and body weight. Follow up in 4 weeks for 7 month post-op visit.

## 2014-12-08 ENCOUNTER — Encounter: Payer: Managed Care, Other (non HMO) | Attending: General Surgery | Admitting: Dietician

## 2014-12-08 ENCOUNTER — Encounter: Payer: Self-pay | Admitting: Dietician

## 2014-12-08 VITALS — Ht 68.0 in | Wt 215.5 lb

## 2014-12-08 DIAGNOSIS — Z6841 Body Mass Index (BMI) 40.0 and over, adult: Secondary | ICD-10-CM | POA: Insufficient documentation

## 2014-12-08 DIAGNOSIS — Z713 Dietary counseling and surveillance: Secondary | ICD-10-CM | POA: Insufficient documentation

## 2014-12-08 NOTE — Patient Instructions (Addendum)
Goals: Follow Phase 3B: High Protein + Non-Starchy Vegetables Eat 3-6 small meals/snacks, every 3-5 hrs Increase lean protein foods to meet 60g goal Increase fluid intake to 64oz + Avoid drinking 15 minutes before, during and 30 minutes after eating Aim for >30 min of physical activity daily Continue to make sure you are hungry in your stomach if you have a snack (instead of eating out of boredom) Plan to have 3 non-protein snacks per week (cheezits/chips/popcorn) Try wine spritzers (no more 3 x week) instead of hard ale Start doing exercise DVDs 3 x week  Surgery date: 05/18/14 Surgery type: Gastric sleeve Start weight at [facility]: 299 lbs on 01/06/14 Weight today: 215.5 lbs  Weight change: 4 lbs Total weight loss: 83.5 lbs Weight loss goal: 175-180 lbs  TANITA  BODY COMP RESULTS  04/27/14 06/02/14 07/14/14 08/18/14 09/29/14 11/03/14 12/08/14   BMI (kg/m^2) 40.5 37.9 36.1 34.8 33.8 33.2 32.8   Fat Mass (lbs) 134 122.0 110.0 100.5 99.0 94.5 89.0   Fat Free Mass (lbs) 132.5 127.5 127.5 128.5 123.5 124.0 126.5   Total Body Water (lbs) 97 93.5 93.5 94.0 90.5 91.0 92.5

## 2014-12-08 NOTE — Progress Notes (Signed)
  Follow-up visit: 7 Months Post-Operative Sleeve Gastrectomy Surgery  Medical Nutrition Therapy:  Appt start time: 200 end time:  225  Primary concerns today: Post-operative Bariatric Surgery Nutrition Management. Returns with an 4 lbs weight loss. Feels like things have not been going well. Has been eating what she wants to in moderation and having things like chips, cheezits, and drinking some alcohol (hard ale). Feels like she is back to wanting carbs (the more she has the more she wants). Has to have carbs around to make kids' lunches. Still maintaining protein.   Trying to have water instead of having snacks, though not getting in as much fluid as before.     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 Hasn't had time to work out.   Surgery date: 05/18/14 Surgery type: Gastric sleeve Start weight at [facility]: 299 lbs on 01/06/14 Weight today: 215.5 lbs  Weight change: 4 lbs Total weight loss: 83.5 lbs Weight loss goal: 175-180 lbs  TANITA  BODY COMP RESULTS  04/27/14 06/02/14 07/14/14 08/18/14 09/29/14 11/03/14 12/08/14   BMI (kg/m^2) 40.5 37.9 36.1 34.8 33.8 33.2 32.8   Fat Mass (lbs) 134 122.0 110.0 100.5 99.0 94.5 89.0   Fat Free Mass (lbs) 132.5 127.5 127.5 128.5 123.5 124.0 126.5   Total Body Water (lbs) 97 93.5 93.5 94.0 90.5 91.0 92.5    Preferred Learning Style:   No preference indicated   Learning Readiness:   Ready  24-hr recall: B (AM): [place] yogurt (12 g) Snk (AM): Premier protein (30 g) L (PM): 3 oz total:  bacon or [place] meat with cheese or 2 oz of tuna with egg or 1/4 cup beans with pepper jack (21 g) Snk (PM): cheese stick or almonds (6 g) D (PM): 6 meatballs with green beans or pork chops with vegetable (3-4 oz protein) (21-28 g) Snk (PM): none  Fluid intake: 11 oz protein shake, 50 oz water, 12 oz decaf coffee (about 56 oz), 3-4 hard ales/white wine per week  Estimated total protein intake: protein shake about 3 x week, usually averaging  84-91 g  Medications: see list  Supplementation:  Started taking them again  Using straws: No Drinking while eating: No Hair loss: No Carbonated beverages: No N/V/D/C: No Dumping syndrome: No  Recent physical activity:  None recently  Progress Towards Goal(s):  In progress.   Nutritional Diagnosis:  [place]-3.3 Overweight/obesity related to past poor dietary habits and physical inactivity as evidenced by patient w/ recent sleeve gastrectomy surgery following dietary guidelines for continued weight loss.    Intervention:  Nutrition education/diet reinforcement Goals: Follow Phase 3B: High Protein + Non-Starchy Vegetables Eat 3-6 small meals/snacks, every 3-5 hrs Increase lean protein foods to meet 60g goal Increase fluid intake to 64oz + Avoid drinking 15 minutes before, during and 30 minutes after eating Aim for >30 min of physical activity daily Continue to make sure you are hungry in your stomach if you have a snack (instead of eating out of boredom) Plan to have 3 non-protein snacks per week (cheezits/chips/popcorn) Try wine spritzers (no more 3 x week) instead of hard ale Start doing exercise DVDs 3 x week  Teaching Method Utilized:  Visual Auditory Hands on  Barriers to learning/adherence to lifestyle change: busy work schedule  Demonstrated degree of understanding via:  Teach Back   Monitoring/Evaluation:  Dietary intake, exercise, and body weight. Follow up in 4 weeks for 8 month post-op visit.

## 2015-01-05 ENCOUNTER — Encounter: Payer: Self-pay | Admitting: Dietician

## 2015-01-05 ENCOUNTER — Encounter: Payer: Managed Care, Other (non HMO) | Attending: General Surgery | Admitting: Dietician

## 2015-01-05 VITALS — Ht 68.0 in | Wt 210.5 lb

## 2015-01-05 DIAGNOSIS — Z6841 Body Mass Index (BMI) 40.0 and over, adult: Secondary | ICD-10-CM

## 2015-01-05 DIAGNOSIS — Z713 Dietary counseling and surveillance: Secondary | ICD-10-CM | POA: Insufficient documentation

## 2015-01-05 NOTE — Progress Notes (Signed)
  Follow-up visit: 8 Months Post-Operative Sleeve Gastrectomy Surgery  Medical Nutrition Therapy:  Appt start time: 810 end time:  825  Primary concerns today: Post-operative Bariatric Surgery Nutrition Management. Returns with an 5 lbs weight loss. Tried doing a wine spritzer one time instead of ale. Has been staying away from the ale. Still having regular wine. Has not gotten back to exercise yet. Working on buying a house so is very busy. Needs to move a DVD player so she can work out more.   Doing better about craving carbs and trying not have them every day.   Surgery date: 05/18/14 Surgery type: Gastric sleeve [facility]: 299 lbs on 01/06/14 Weight today: 210.5  Weight change: 5 Lbs loss Total weight loss: 83.5 lbs Weight loss goal: 175-180 lbs  TANITA  BODY COMP RESULTS  04/27/14 06/02/14 07/14/14 08/18/14 09/29/14 11/03/14 12/08/14 01/05/15   BMI (kg/m^2) 40.5 37.9 36.1 34.8 33.8 33.2 32.8 32.0   Fat Mass (lbs) 134 122.0 110.0 100.5 99.0 94.5 89.0 91.0   Fat Free Mass (lbs) 132.5 127.5 127.5 128.5 123.5 124.0 126.5 119.5   Total Body Water (lbs) 97 93.5 93.5 94.0 90.5 91.0 92.5 87.5    Preferred Learning Style:   No preference indicated   Learning Readiness:   Ready  24-hr recall: B (AM): [place] yogurt (12 g) Snk (AM): Premier protein (30 g) L (PM): 3 oz total:  bacon or [place] meat with cheese or 2 oz of tuna with egg or 1/4 cup beans with pepper jack (21 g) Snk (PM): cheese stick or almonds (6 g) D (PM): 6 meatballs with green beans or pork chops with vegetable (3-4 oz protein) (21-28 g) Snk (PM): none  Fluid intake: 11 oz protein shake, 50 oz water, 12 oz decaf coffee (about 56 oz), 2-3 glasses white wine per week  Estimated total protein intake: protein shake about 3 x week, usually averaging 84-91 g  Medications: see list  Supplementation:  Started taking them again  Using straws: No Drinking while eating: No Hair loss: No Carbonated beverages:  No N/V/D/C: No Dumping syndrome: No  Recent physical activity:  None recently  Progress Towards Goal(s):  In progress.   Nutritional Diagnosis:  [place]-3.3 Overweight/obesity related to past poor dietary habits and physical inactivity as evidenced by patient w/ recent sleeve gastrectomy surgery following dietary guidelines for continued weight loss.    Intervention:  Nutrition education/diet reinforcement Goals: Follow Phase 3B: High Protein + Non-Starchy Vegetables Eat 3-6 small meals/snacks, every 3-5 hrs Increase lean protein foods to meet 60g goal Increase fluid intake to 64oz + Avoid drinking 15 minutes before, during and 30 minutes after eating Aim for >30 min of physical activity daily Continue to make sure you are hungry in your stomach if you have a snack (instead of eating out of boredom) Plan to have 3 non-protein snacks per week (cheezits/chips/popcorn) Start doing exercise DVDs 3 x week  Try to focus on protein and non starchy vegetables when possible throughout the move  Teaching Method Utilized:  Visual Auditory Hands on  Barriers to learning/adherence to lifestyle change: busy work schedule  Demonstrated degree of understanding via:  Teach Back   Monitoring/Evaluation:  Dietary intake, exercise, and body weight. Follow up in 6 weeks for 9 month post-op visit.

## 2015-01-05 NOTE — Patient Instructions (Addendum)
Goals: Follow Phase 3B: High Protein + Non-Starchy Vegetables Eat 3-6 small meals/snacks, every 3-5 hrs Increase lean protein foods to meet 60g goal Increase fluid intake to 64oz + Avoid drinking 15 minutes before, during and 30 minutes after eating Aim for >30 min of physical activity daily Continue to make sure you are hungry in your stomach if you have a snack (instead of eating out of boredom) Plan to have 3 non-protein snacks per week (cheezits/chips/popcorn) Start doing exercise DVDs 3 x week  Try to focus on protein and non starchy vegetables when possible throughout the move  Surgery date: 05/18/14 Surgery type: Gastric sleeve [facility] lbs on 01/06/14 Weight today: 210.5  Weight change: 5 Lbs loss Total weight loss: 83.5 lbs Weight loss goal: 175-180 lbs  TANITA  BODY COMP RESULTS  04/27/14 06/02/14 07/14/14 08/18/14 09/29/14 11/03/14 12/08/14 01/05/15   BMI (kg/m^2) 40.5 37.9 36.1 34.8 33.8 33.2 32.8 32.0   Fat Mass (lbs) 134 122.0 110.0 100.5 99.0 94.5 89.0 91.0   Fat Free Mass (lbs) 132.5 127.5 127.5 128.5 123.5 124.0 126.5 119.5   Total Body Water (lbs) 97 93.5 93.5 94.0 90.5 91.0 92.5 87.5

## 2015-02-16 ENCOUNTER — Encounter: Payer: Self-pay | Admitting: Dietician

## 2015-02-16 ENCOUNTER — Encounter: Payer: Managed Care, Other (non HMO) | Attending: General Surgery | Admitting: Dietician

## 2015-02-16 VITALS — Ht 68.0 in | Wt 210.5 lb

## 2015-02-16 DIAGNOSIS — Z6841 Body Mass Index (BMI) 40.0 and over, adult: Secondary | ICD-10-CM | POA: Diagnosis not present

## 2015-02-16 DIAGNOSIS — Z713 Dietary counseling and surveillance: Secondary | ICD-10-CM | POA: Diagnosis not present

## 2015-02-16 NOTE — Patient Instructions (Addendum)
Goals: Follow Phase 3B: High Protein + Non-Starchy Vegetables Eat 3-6 small meals/snacks, every 3-5 hrs Increase lean protein foods to meet 60g goal Increase fluid intake to 64oz + Avoid drinking 15 minutes before, during and 30 minutes after eating Aim for >30 min of physical activity daily Continue to make sure you are hungry in your stomach if you have a snack (instead of eating out of boredom) Continue to have 3 non-protein snacks per week (cheezits/chips/popcorn) Make a plan for going to gym  Surgery date: 05/18/14 Surgery type: Gastric sleeve [facility] lbs on 01/06/14 Weight today: 210.5 lbs  Weight change: 2.5 fat mass loss Total weight loss: 83.5 lbs Weight loss goal: 175-180 lbs  TANITA  BODY COMP RESULTS  04/27/14 06/02/14 07/14/14 08/18/14 09/29/14 11/03/14 12/08/14 01/05/15 02/16/15   BMI (kg/m^2) 40.5 37.9 36.1 34.8 33.8 33.2 32.8 32.0 32.0   Fat Mass (lbs) 134 122.0 110.0 100.5 99.0 94.5 89.0 91.0 88.5   Fat Free Mass (lbs) 132.5 127.5 127.5 128.5 123.5 124.0 126.5 119.5 122.0   Total Body Water (lbs) 97 93.5 93.5 94.0 90.5 91.0 92.5 87.5 89.5

## 2015-02-16 NOTE — Progress Notes (Signed)
  Follow-up visit: 9 Months Post-Operative Sleeve Gastrectomy Surgery  Medical Nutrition Therapy:  Appt start time: 845 end time:  905  Primary concerns today: Post-operative Bariatric Surgery Nutrition Management. Returns with an 2.5 lbs fat mass loss. Expected to gain weight. Hasn't been eating right. Finished moving and settling in recently. Starting to get back on track (having eggs, protein shakes, salads with Malawiturkey, and egg). Rejoined the gym.   Surgery date: 05/18/14 Surgery type: Gastric sleeve [facility]: 299 lbs on 01/06/14 Weight today: 210.5 lbs  Weight change: 2.5 fat mass loss Total weight loss: 83.5 lbs Weight loss goal: 175-180 lbs  TANITA  BODY COMP RESULTS  04/27/14 06/02/14 07/14/14 08/18/14 09/29/14 11/03/14 12/08/14 01/05/15 02/16/15   BMI (kg/m^2) 40.5 37.9 36.1 34.8 33.8 33.2 32.8 32.0 32.0   Fat Mass (lbs) 134 122.0 110.0 100.5 99.0 94.5 89.0 91.0 88.5   Fat Free Mass (lbs) 132.5 127.5 127.5 128.5 123.5 124.0 126.5 119.5 122.0   Total Body Water (lbs) 97 93.5 93.5 94.0 90.5 91.0 92.5 87.5 89.5    Preferred Learning Style:   No preference indicated   Learning Readiness:   Ready  24-hr recall: B (AM): 2 eggs with sausage or Malawiturkey (21 g) Snk (AM): Nectar (20 g) L (PM): [employer]'s chili(15 g) Snk (PM): cheese (6 g) D (PM): 3 oz Malawiturkey with an egg on salad (28 g) Snk (PM): none  Fluid intake: 11 oz protein shake, 50 oz water, 12 oz decaf coffee (about 56 oz), 2-3 glasses white wine per week  Estimated total protein intake: ~90g  Medications: see list  Supplementation:  taking  Using straws: No Drinking while eating: No Hair loss: No Carbonated beverages: No N/V/D/C: No Dumping syndrome: No  Recent physical activity:  None recently  Progress Towards Goal(s):  In progress.   Nutritional Diagnosis:  Steward-3.3 Overweight/obesity related to past poor dietary habits and physical inactivity as evidenced by patient w/ recent sleeve gastrectomy surgery  following dietary guidelines for continued weight loss.    Intervention:  Nutrition education/diet reinforcement Goals: Follow Phase 3B: High Protein + Non-Starchy Vegetables Eat 3-6 small meals/snacks, every 3-5 hrs Increase lean protein foods to meet 60g goal Increase fluid intake to 64oz + Avoid drinking 15 minutes before, during and 30 minutes after eating Aim for >30 min of physical activity daily Continue to make sure you are hungry in your stomach if you have a snack (instead of eating out of boredom) Continue to have 3 non-protein snacks per week (cheezits/chips/popcorn) Make a plan for going to gym  Teaching Method Utilized:  Visual Auditory Hands on  Barriers to learning/adherence to lifestyle change: busy work schedule  Demonstrated degree of understanding via:  Teach Back   Monitoring/Evaluation:  Dietary intake, exercise, and body weight. Follow up in 2 months for 11 month post-op visit.

## 2015-04-20 ENCOUNTER — Ambulatory Visit: Payer: Managed Care, Other (non HMO) | Admitting: Dietician

## 2015-06-08 ENCOUNTER — Encounter: Payer: BLUE CROSS/BLUE SHIELD | Attending: General Surgery | Admitting: Dietician

## 2015-06-08 ENCOUNTER — Encounter: Payer: Self-pay | Admitting: Dietician

## 2015-06-08 DIAGNOSIS — Z029 Encounter for administrative examinations, unspecified: Secondary | ICD-10-CM | POA: Diagnosis not present

## 2015-06-08 NOTE — Progress Notes (Signed)
  Follow-up visit: 12 Months Post-Operative Sleeve Gastrectomy Surgery  Medical Nutrition Therapy:  Appt start time: 1030 end time: 1050   Primary concerns today: Post-operative Bariatric Surgery Nutrition Management. Returns with an 3.0 lb weight gain. Has not been going to the gym and having trouble fitting it into her schedule. Work is less busy than it was and has been spending more time with her kids. Eating "whatever she wants" and having bread now. Watching portions but not focusing on protein goal.   Having inconsistent meals.   Surgery date: 05/18/14 Surgery type: Gastric sleeve [facility] lbs on 01/06/14 Weight today: 213.5 lbs  Weight change: 23 lbs weight gain Total weight loss: 80.5 lbs Weight loss goal: 175-180 lbs  TANITA  BODY COMP RESULTS  04/27/14 06/02/14 07/14/14 08/18/14 09/29/14 11/03/14 12/08/14 01/05/15 02/16/15 06/08/15   BMI (kg/m^2) 40.5 37.9 36.1 34.8 33.8 33.2 32.8 32.0 32.0 32.5   Fat Mass (lbs) 134 122.0 110.0 100.5 99.0 94.5 89.0 91.0 88.5 89.0   Fat Free Mass (lbs) 132.5 127.5 127.5 128.5 123.5 124.0 126.5 119.5 122.0 124.5   Total Body Water (lbs) 97 93.5 93.5 94.0 90.5 91.0 92.5 87.5 89.5 91.0    Preferred Learning Style:   No preference indicated   Learning Readiness:   Ready  24-hr recall: B (AM): 2 eggs with sausage or Malawiturkey (21 g) Snk (AM): none L (PM): summer sausage and cheese 15 g) Snk (PM): cheezits and cheese (7g) D (PM): chili, pork chops, tortellini with meat balls (21 g) Snk (PM): none  Fluid intake: 50 oz water, 12 oz decaf coffee, 2-3 glasses red wine per week  Estimated total protein intake: ~60g  Medications: see list  Supplementation:  Not taking  Using straws: No Drinking while eating: No Hair loss: No Carbonated beverages: No N/V/D/C: No Dumping syndrome: No  Recent physical activity:  None recently  Progress Towards Goal(s):  In progress.   Nutritional Diagnosis:  Shelby-3.3 Overweight/obesity related to  past poor dietary habits and physical inactivity as evidenced by patient w/ recent sleeve gastrectomy surgery following dietary guidelines for continued weight loss.    Intervention:  Nutrition education/diet reinforcement Goals: Follow Phase 3B: High Protein + Non-Starchy Vegetables Eat 3-6 small meals/snacks, every 3-5 hrs Increase lean protein foods to meet 60g goal Increase fluid intake to 64oz + Avoid drinking 15 minutes before, during and 30 minutes after eating Aim for >30 min of physical activity daily Cut out the cheezits Look for crock pot dishes - have daughter help with recipes/cooking Make a plan for going to gym after getting back from vacation  Teaching Method Utilized:  Visual Auditory Hands on  Barriers to learning/adherence to lifestyle change: busy work schedule  Demonstrated degree of understanding via:  Teach Back   Monitoring/Evaluation:  Dietary intake, exercise, and body weight. Follow up in 3 months for 15 month post-op visit.

## 2015-06-08 NOTE — Patient Instructions (Addendum)
Goals: Follow Phase 3B: High Protein + Non-Starchy Vegetables Eat 3-6 small meals/snacks, every 3-5 hrs Increase lean protein foods to meet 60g goal Increase fluid intake to 64oz + Avoid drinking 15 minutes before, during and 30 minutes after eating Aim for >30 min of physical activity daily Cut out the cheezits Look for crock pot dishes - have daughter help with recipes/cooking Make a plan for going to gym after getting back from vacation  Surgery date: 05/18/14 Surgery type: Gastric sleeve [facility]: 299 lbs on 01/06/14 Weight today: 213.5 lbs  Weight change: 23 lbs weight gain Total weight loss: 80.5 lbs Weight loss goal: 175-180 lbs  TANITA  BODY COMP RESULTS  04/27/14 06/02/14 07/14/14 08/18/14 09/29/14 11/03/14 12/08/14 01/05/15 02/16/15 06/08/15   BMI (kg/m^2) 40.5 37.9 36.1 34.8 33.8 33.2 32.8 32.0 32.0 32.5   Fat Mass (lbs) 134 122.0 110.0 100.5 99.0 94.5 89.0 91.0 88.5 89.0   Fat Free Mass (lbs) 132.5 127.5 127.5 128.5 123.5 124.0 126.5 119.5 122.0 124.5   Total Body Water (lbs) 97 93.5 93.5 94.0 90.5 91.0 92.5 87.5 89.5 91.0

## 2015-09-08 ENCOUNTER — Ambulatory Visit: Payer: Managed Care, Other (non HMO) | Admitting: Dietician

## 2015-12-21 ENCOUNTER — Encounter (HOSPITAL_COMMUNITY): Payer: Self-pay

## 2017-03-29 ENCOUNTER — Ambulatory Visit (INDEPENDENT_AMBULATORY_CARE_PROVIDER_SITE_OTHER): Payer: BLUE CROSS/BLUE SHIELD | Admitting: Psychology

## 2017-03-29 ENCOUNTER — Encounter (HOSPITAL_COMMUNITY): Payer: Self-pay | Admitting: Psychology

## 2017-03-29 DIAGNOSIS — F1010 Alcohol abuse, uncomplicated: Secondary | ICD-10-CM

## 2017-03-29 DIAGNOSIS — Z9884 Bariatric surgery status: Secondary | ICD-10-CM | POA: Diagnosis not present

## 2017-03-29 DIAGNOSIS — F314 Bipolar disorder, current episode depressed, severe, without psychotic features: Secondary | ICD-10-CM | POA: Diagnosis not present
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
## 2017-04-03 ENCOUNTER — Ambulatory Visit (HOSPITAL_COMMUNITY)
Admission: RE | Admit: 2017-04-03 | Discharge: 2017-04-03 | Disposition: A | Discharge status: Home / Self Care | Payer: BLUE CROSS/BLUE SHIELD | Attending: Psychiatry | Admitting: Psychiatry

## 2017-04-03 DIAGNOSIS — Z9884 Bariatric surgery status: Secondary | ICD-10-CM | POA: Insufficient documentation

## 2017-04-03 DIAGNOSIS — F314 Bipolar disorder, current episode depressed, severe, without psychotic features: Secondary | ICD-10-CM | POA: Diagnosis present

## 2017-04-03 DIAGNOSIS — F1010 Alcohol abuse, uncomplicated: Secondary | ICD-10-CM | POA: Insufficient documentation
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
## 2017-04-03 NOTE — H&P (Signed)
Behavioral Health Medical Screening Exam  Quantia Roe RutherfordM Otwell is an 42 y.o. female with PMH of Bipolar 1 disorder, anxiety and depression and is receiving OP services from Dr. Baldo Asharl, here today seeking resources for substance abuse treatment. Patient reports needing help with her drinking behavior. She denies any active suicide or homicide ideations as well as visual or auditory hallucinations.   Total Time spent with patient: 20 minutes  Psychiatric Specialty Exam: Physical Exam  Vitals reviewed. Constitutional: She is oriented to person, place, and time. She appears well-developed and well-nourished.  HENT:  Head: Normocephalic and atraumatic.  Eyes: Pupils are equal, round, and reactive to light.  Neck: Normal range of motion.  Cardiovascular: Normal rate, regular rhythm and normal heart sounds.  Respiratory: Effort normal and breath sounds normal.  GI: Soft. Bowel sounds are normal.  Musculoskeletal: Normal range of motion.  Neurological: She is alert and oriented to person, place, and time.  Skin: Skin is warm and dry.    Review of Systems  Psychiatric/Behavioral: Positive for depression and substance abuse. Negative for hallucinations and suicidal ideas. The patient is nervous/anxious. The patient does not have insomnia.   All other systems reviewed and are negative.   Blood pressure 127/72, pulse 78, temperature 98.6 F (37 C), temperature source Oral, resp. rate 18, SpO2 100 %.There is no height or weight on file to calculate BMI.  General Appearance: Casual and Well Groomed  Eye Contact:  Good  Speech:  Clear and Coherent and Normal Rate  Volume:  Normal  Mood:  Anxious and Depressed  Affect:  Appropriate  Thought Process:  Coherent and Goal Directed  Orientation:  Full (Time, Place, and Person)  Thought Content:  WDL and Logical  Suicidal Thoughts:  No  Homicidal Thoughts:  No  Memory:  Immediate;   Good Recent;   Good Remote;   Fair  Judgement:  Good  Insight:  Good   Psychomotor Activity:  Normal  Concentration: Concentration: Good and Attention Span: Good  Recall:  Good  Fund of Knowledge:Good  Language: Good  Akathisia:  Negative  Handed:  Right  AIMS (if indicated):     Assets:  Communication Skills Desire for Improvement Financial Resources/Insurance Housing Leisure Time Physical Health Resilience Social Support  Sleep:       Musculoskeletal: Strength & Muscle Tone: within normal limits Gait & Station: normal Patient leans: N/A  Blood pressure 127/72, pulse 78, temperature 98.6 F (37 C), temperature source Oral, resp. rate 18, SpO2 100 %.  Recommendations:  Based on my evaluation the patient does not appear to have an emergency medical condition.  Delila PereyraJustina A Okonkwo, NP 04/03/2017, 3:39 PM

## 2017-10-19 ENCOUNTER — Other Ambulatory Visit: Payer: Self-pay | Admitting: Family Medicine

## 2017-10-19 DIAGNOSIS — Z1231 Encounter for screening mammogram for malignant neoplasm of breast: Secondary | ICD-10-CM

## 2017-12-21 ENCOUNTER — Ambulatory Visit
Admission: RE | Admit: 2017-12-21 | Discharge: 2017-12-21 | Disposition: A | Discharge status: Home / Self Care | Payer: BLUE CROSS/BLUE SHIELD | Source: Ambulatory Visit | Attending: Family Medicine | Admitting: Family Medicine

## 2017-12-21 DIAGNOSIS — Z1231 Encounter for screening mammogram for malignant neoplasm of breast: Secondary | ICD-10-CM

## 2018-04-16 ENCOUNTER — Ambulatory Visit: Payer: BLUE CROSS/BLUE SHIELD | Admitting: Skilled Nursing Facility1

## 2018-05-22 ENCOUNTER — Encounter: Payer: BLUE CROSS/BLUE SHIELD | Attending: General Surgery | Admitting: Skilled Nursing Facility1

## 2018-05-22 ENCOUNTER — Other Ambulatory Visit: Payer: Self-pay

## 2018-05-22 DIAGNOSIS — E669 Obesity, unspecified: Secondary | ICD-10-CM

## 2018-05-22 NOTE — Patient Instructions (Addendum)
-  Rely on hobbies instead of food to fill your time  -Use your should I eat sheet to identify whether you are hungry or it is a craving   -At least 50 grams of carbohydrate a day  -Grocery shop at least 2 times a month  -Aim for 64 fluid ounces every day: use a measured bottle

## 2018-05-22 NOTE — Progress Notes (Signed)
Follow-up visit: Post-Operative Sleeve Gastrectomy Surgery  Medical Nutrition Therapy:  Appt start time: 1030 end time: 11:45   Primary concerns today: Post-operative Bariatric Surgery Nutrition Management.   Pt state she had surgery in 2016 and fell off the wagon and wants to get back on the wagon. Pt states she has been doing diets off and on. Pt states she was doing weight watchers but states they do not seem appropriate for having had the surgery. Pt states she is feeling really good about her bipolar treatment and feels it is working well. Pt states she needs to find the energy to want to work out. Pt state she does not want to lose muscle mass. Pt states that she recognizes she eats what she is not supposed to eat due to comfort. Pt reports feeling sad about the loss of energy/motivation to go to the gym. Pt states her mother is the cook in her house and states she does not like cooking for herself. Pt states she really needs to rely on herself and not her mother. Son (every other week and picky eater) and daughter in house as well daughter being vegetarian. Pt states she cannot over eat because it will cause pain. Pt states that eating food and watching TV are hobbies. Pt states she is not meeting her fluid goal, will begin to work on it. Pt states that she needs to go to the grocery store and that no one in the house is currently eating "the way they should". Pt denies taking bariatric MVI.   Provided pt Should I Eat, Meal Ideas, Post-Op resources (apps), and What Am I Feeling handout. Encouraged pt to utilize the Meal Ideas handout to create realistic meals for herself and to review the 2 What Am I Feeling handouts when eating emotionally or out of boredom.    Surgery date: 05/18/14 Surgery type: Gastric sleeve Start weight at [facility]: 299 lbs on 01/06/14 Weight today: 213.5 lbs  Body Composition Scale  Total Body Fat: 40.5 %  Visceral Fat: 11  Fat-Free Mass: 59.4  %   Total Body  Water: 44.2  %  Muscle-Mass: 35.1 lbs  Body Fat Displacement:  Torso: 58.5  lbs Left Leg: 11.7 lbs Right Leg: 11.7  lbs Left Arm: 5.8 lbs Right Arm: 5.8 lbs   24-hr recall:  B (AM): 2 eggs w ith sausage or [place] (21 g) Snk (AM): none L (PM): summer sausage and cheese 15 g) Snk (PM): cheezits and cheese (7g) D (PM): chili, pork chops, tortellini with meat balls (21 g) Snk (PM): none  Fluid intake: 50 oz water, 12 oz decaf coffee, 2-3 glasses red wine per week  Estimated total protein intake: ~60g  Medications: see list  Supplementation: Taking regular MVI, not bariatric MVI  Using straws: no Drinking while eating: no Having you been chewing well: yes Chewing/swallowing difficulties: no Changes in vision: no Changes to mood/headaches: no Hair loss/Cahnges to skin/Changes to nails: no Any difficulty focusing or concentrating: no Sweating: no Dizziness/Lightheaded: no Palpitations: no  Carbonated beverages: yes N/V/D/C/GAS: no Abdominal Pain: no Dumping syndrome: no  Recent physical activity:  None recently, previously going to the gym 2-3x week.  Progress Towards Goal(s):  In progress.              Nutritional Diagnosis:  -3.3 Overweight/obesity related to past poor dietary habits and physical inactivity as evidenced by patient w/ recent sleeve gastrectomy surgery following dietary guidelines for continued weight loss.  Intervention:  Nutrition education/diet reinforcement  Goals: -Rely on hobbies instead of food to fill your time  -Use your Should I Eat sheet to identify whether you are hungry or it is a craving   -At least 50 grams of carbohydrate a day  -Grocery shop at least 2 times a month  -Aim for 64 fluid ounces every day: use a measured bottle  Teaching Method Utilized:  Visual Auditory Hands on  Barriers to learning/adherence to lifestyle change: mental health  Demonstrated degree of understanding via:  Teach Back    Monitoring/Evaluation:  Dietary intake, exercise, and body weight. Follow up in 1 month.

## 2018-06-18 ENCOUNTER — Ambulatory Visit: Payer: BLUE CROSS/BLUE SHIELD | Admitting: Skilled Nursing Facility1

## 2019-01-29 ENCOUNTER — Other Ambulatory Visit: Payer: Self-pay | Admitting: Family Medicine

## 2019-01-29 DIAGNOSIS — Z1231 Encounter for screening mammogram for malignant neoplasm of breast: Secondary | ICD-10-CM

## 2019-02-11 ENCOUNTER — Other Ambulatory Visit: Payer: Self-pay | Admitting: Family Medicine

## 2019-02-11 DIAGNOSIS — Z20822 Contact with and (suspected) exposure to covid-19: Secondary | ICD-10-CM

## 2019-02-13 LAB — NOVEL CORONAVIRUS, NAA: SARS-CoV-2, NAA: NOT DETECTED

## 2019-03-24 ENCOUNTER — Ambulatory Visit: Payer: BLUE CROSS/BLUE SHIELD

## 2020-03-18 ENCOUNTER — Ambulatory Visit (INDEPENDENT_AMBULATORY_CARE_PROVIDER_SITE_OTHER): Payer: BLUE CROSS/BLUE SHIELD | Admitting: Obstetrics & Gynecology

## 2020-03-18 ENCOUNTER — Other Ambulatory Visit: Payer: Self-pay

## 2020-03-18 ENCOUNTER — Other Ambulatory Visit (HOSPITAL_COMMUNITY)
Admission: RE | Admit: 2020-03-18 | Discharge: 2020-03-18 | Disposition: A | Discharge status: Home / Self Care | Payer: BC Managed Care – PPO | Source: Ambulatory Visit | Attending: Obstetrics & Gynecology | Admitting: Obstetrics & Gynecology

## 2020-03-18 ENCOUNTER — Encounter: Payer: Self-pay | Admitting: Obstetrics & Gynecology

## 2020-03-18 VITALS — BP 124/87 | HR 89 | Ht 68.0 in | Wt 220.0 lb

## 2020-03-18 DIAGNOSIS — Z3009 Encounter for other general counseling and advice on contraception: Secondary | ICD-10-CM | POA: Diagnosis not present

## 2020-03-18 DIAGNOSIS — Z01419 Encounter for gynecological examination (general) (routine) without abnormal findings: Secondary | ICD-10-CM | POA: Insufficient documentation

## 2020-03-18 DIAGNOSIS — Z1231 Encounter for screening mammogram for malignant neoplasm of breast: Secondary | ICD-10-CM

## 2020-03-18 DIAGNOSIS — R87810 Cervical high risk human papillomavirus (HPV) DNA test positive: Secondary | ICD-10-CM | POA: Insufficient documentation

## 2020-03-18 NOTE — Progress Notes (Signed)
GYNECOLOGY ANNUAL PREVENTATIVE CARE ENCOUNTER NOTE  History:     Meredith Cochran is a 45 y.o. [id number] female here for a routine annual gynecologic exam.  Current complaints: none.  Wants to discuss getting her tubes removed for contraception. Does not want anything hormonal. Denies abnormal vaginal bleeding, discharge, pelvic pain, problems with intercourse or other gynecologic concerns.    Gynecologic History Patient's last menstrual period was 03/11/2020 (approximate). Contraception: abstinence Last Pap: 02/2019. Results were: normal with negative HPV Last mammogram: 2019. Results were: normal  Obstetric History OB History  Gravida Para Term Preterm AB Living  3 2 2   1 2   SAB IAB Ectopic Multiple Live Births    1     2    # Outcome Date GA Lbr Len/2nd Weight Sex Delivery Anes PTL Lv  3 IAB 2009          2 Term 05/05/05    M Vag-Spont None  LIV  1 Term 08/16/00    F Vag-Spont None  LIV    Obstetric Comments  Unsure of dating     Past Medical History:  Diagnosis Date  . Asthma    triggered by cold  . Bipolar disorder (HCC)   . Depression   . GERD (gastroesophageal reflux disease)   . History of hiatal hernia   . Tremor    states left hand on occasion"from some of the psychotic meds in the past"    Past Surgical History:  Procedure Laterality Date  . LAPAROSCOPIC GASTRIC SLEEVE RESECTION WITH HIATAL HERNIA REPAIR N/A 05/18/2014   Procedure: LAPAROSCOPIC GASTRIC SLEEVE RESECTION WITH HIATAL HERNIA REPAIR;  Surgeon: 07/18/2014, MD;  Location: [facility];  Service: General;  Laterality: N/A;  . NO PAST SURGERIES    . UPPER GI ENDOSCOPY N/A 05/18/2014   Procedure: UPPER GI ENDOSCOPY;  Surgeon: 07/18/2014, MD;  Location: [facility];  Service: General;  Laterality: N/A;    Current Outpatient Medications on File Prior to Visit  Medication Sig Dispense Refill  . amphetamine-dextroamphetamine (ADDERALL) 15 MG tablet Take 15 mg by mouth every morning.     . clonazePAM  (KLONOPIN) 1 MG tablet Take 1 mg by mouth 2 (two) times daily as needed for anxiety.     . lamoTRIgine (LAMICTAL) 200 MG tablet Take 400 mg by mouth every morning.     . Multiple Vitamins-Minerals (MULTIVITAMIN & MINERAL PO) Take 1 tablet by mouth 4 (four) times daily. Beriatric (Fusion) chewable.    . ondansetron (ZOFRAN ODT) 4 MG disintegrating tablet Take 1 tablet (4 mg total) by mouth every 8 (eight) hours as needed for nausea or vomiting. (Patient not taking: Reported on 03/18/2020) 20 tablet 0  . oxyCODONE (ROXICODONE) 5 MG/5ML solution Take 5-10 mLs (5-10 mg total) by mouth every 4 (four) hours as needed for moderate pain or severe pain. (Patient not taking: No sig reported)  0  . pantoprazole (PROTONIX) 40 MG tablet Take 40 mg by mouth every morning.  (Patient not taking: Reported on 03/18/2020)    . Vitamin D, Ergocalciferol, (DRISDOL) 50000 UNITS CAPS capsule Take 50,000 Units by mouth every 7 (seven) days. Monday (Patient not taking: Reported on 03/18/2020)     No current facility-administered medications on file prior to visit.    No Known Allergies  Social History:  reports that she quit smoking about 9 years ago. Her smoking use included cigarettes. She has a 1.00 pack-year smoking history. She has never used smokeless tobacco. She  reports current alcohol use. She reports that she does not use drugs.  Family History  Problem Relation Age of Onset  . Breast cancer Paternal Aunt 64  . Breast cancer Paternal Grandmother 62  . Breast cancer Paternal Aunt 24    The following portions of the patient's history were reviewed and updated as appropriate: allergies, current medications, past family history, past medical history, past social history, past surgical history and problem list.  Review of Systems Pertinent items noted in HPI and remainder of comprehensive ROS otherwise negative.  Physical Exam:  BP 124/87   Pulse 89   Ht 5\' 8"  (1.727 m)   Wt 220 lb (99.8 kg)   LMP 03/11/2020  (Approximate)   BMI 33.45 kg/m  CONSTITUTIONAL: Well-developed, well-nourished female in no acute distress.  HENT:  Normocephalic, atraumatic, External right and left ear normal.  EYES: Conjunctivae and EOM are normal. Pupils are equal, round, and reactive to light. No scleral icterus.  NECK: Normal range of motion, supple, no masses.  Normal thyroid.  SKIN: Skin is warm and dry. No rash noted. Not diaphoretic. No erythema. No pallor. MUSCULOSKELETAL: Normal range of motion. No tenderness.  No cyanosis, clubbing, or edema.   NEUROLOGIC: Alert and oriented to person, place, and time. Normal reflexes, muscle tone coordination.  PSYCHIATRIC: Normal mood and affect. Normal behavior. Normal judgment and thought content. CARDIOVASCULAR: Normal heart rate noted, regular rhythm RESPIRATORY: Clear to auscultation bilaterally. Effort and breath sounds normal, no problems with respiration noted. BREASTS: Symmetric in size. No masses, tenderness, skin changes, nipple drainage, or lymphadenopathy bilaterally. Performed in the presence of a chaperone. ABDOMEN: Soft, no distention noted.  No tenderness, rebound or guarding.  PELVIC: Normal appearing external genitalia and urethral meatus; normal appearing vaginal mucosa and cervix.  No abnormal discharge noted.  Pap smear obtained.  Normal uterine size, no other palpable masses, no uterine or adnexal tenderness.  Performed in the presence of a chaperone.   Assessment and Plan:      1. General counseling and advice on female contraception Desires bilateral removal of tubes for contraception.  Other forms of contraception were discussed with patient and emphasized alternatives of vasectomy, IUDs and Nexplanon as they have equivalent contraceptive efficacy; she declines all other modalities but is considering Paragard IUD. Discussed bilateral salpingectomy in detail. Risks and benefits discussed in detail including but not limited to: risk of regret, permanence of  method, bleeding, infection, injury to surrounding organs and need for additional procedures.  Failure risk of  <1% for bilateral salpingectomy with increased risk of ectopic gestation if pregnancy occurs was also discussed with patient.  Also discussed possibility of post-tubal pain syndrome.  Discussed possible reduction of risk of ovarian cancer via bilateral salpingectomy given that a growing body of knowledge reveals that the majority of cases of high grade serous "ovarian" cancer actually are actually  cancers arising from the fimbriated end of the fallopian tubes. Emphasized that removal of fallopian tubes do not result in any known hormonal imbalance.  Also counseled about IUD placement, in case she decides to go with this.   Discussed risks of irregular bleeding, cramping, infection, malpositioning or misplacement of the IUD outside the uterus which may require further procedure such as laparoscopy.  Also discussed >99% contraception efficacy, increased risk of ectopic pregnancy with failure of method. Patient verbalized understanding of these risks and benefits and wants to proceed with sterilization with laparoscopic bilateral salpingectomy.    Patient was told that the likelihood that her  condition and symptoms will be treated effectively with this surgical management was very high; the postoperative expectations were also discussed in detail. The patient also understands the alternative treatment options which were discussed in full. All questions were answered.  She was told that she will be contacted by our surgical scheduler regarding the time and date of her surgery; routine preoperative instructions will be given to her by the preoperative nursing team.   She is aware of need for preoperative COVID testing and subsequent quarantine from time of test to time of surgery; she will be given further preoperative instructions at that [place] screening visit. Printed patient education handouts about the  procedure and IUDs were given to the patient to review at home.  2. Breast cancer screening by mammogram - MM 3D SCREEN BREAST BILATERAL; Future Mammogram scheduled  3. Well woman exam with routine gynecological exam - Cytology - PAP - Hepatitis C antibody - Hepatitis B surface antigen Will follow up results of pap smear and labs and manage accordingly. Routine preventative health maintenance measures emphasized. Please refer to After Visit Summary for other counseling recommendations.      [name], MD, [place] for [employer], [place]

## 2020-03-18 NOTE — Patient Instructions (Addendum)
Laparoscopic Salpingectomy (your insurance covers this for contraception)   Laparoscopic Tubal Ligation Laparoscopic tubal ligation is a procedure to close the fallopian tubes. This is done so that you cannot get pregnant. When the fallopian tubes are closed, the eggs that your ovaries release cannot enter the uterus, and sperm cannot reach the released eggs. You should not have this procedure if you want to get pregnant someday or if you are unsure about having more children. Tell a health care provider about:  Any allergies you have.  All medicines you are taking, including vitamins, herbs, eye drops, creams, and over-the-counter medicines.  Any problems you or family members have had with anesthetic medicines.  Any blood disorders you have.  Any surgeries you have had.  Any medical conditions you have.  Whether you are pregnant or may be pregnant.  Any past pregnancies. What are the risks? Generally, this is a safe procedure. However, problems may occur, including:  Infection.  Bleeding.  Injury to other organs in the abdomen.  Side effects from anesthetic medicines.  Failure of the procedure. This procedure can increase your risk of a kind of pregnancy in which a fertilized egg attaches to the outside of the uterus (ectopic pregnancy). What happens before the procedure? Medicines  Ask your health care provider about: ? Changing or stopping your regular medicines. This is especially important if you are taking diabetes medicines or blood thinners. ? Taking medicines such as aspirin and ibuprofen. These medicines can thin your blood. Do not take these medicines unless your health care provider tells you to take them. ? Taking over-the-counter medicines, vitamins, herbs, and supplements. Staying hydrated  Follow instructions from your health care provider about hydration, which may include: ? Up to 2 hours before the procedure - you may continue to drink clear liquids,  such as water, clear fruit juice, black coffee, and plain tea. Eating and drinking  Follow instructions from your health care provider about eating and drinking, which may include: ? 8 hours before the procedure - stop eating heavy meals or foods, such as meat, fried foods, or fatty foods. ? 6 hours before the procedure - stop eating light meals or foods, such as toast or cereal. ? 6 hours before the procedure - stop drinking milk or drinks that contain milk. ? 2 hours before the procedure - stop drinking clear liquids. General instructions  Do not use any products that contain nicotine or tobacco for at least 4 weeks before the procedure. These products include cigarettes, e-cigarettes, and chewing tobacco. If you need help quitting, ask your health care provider.  Plan to have someone take you home from the hospital.  If you will be going home right after the procedure, plan to have someone with you for 24 hours.  Ask your health care provider: ? How your surgery site will be marked. ? What steps will be taken to help prevent infection. These may include:  Removing hair at the surgery site.  Washing skin with a germ-killing soap.  Taking antibiotic medicine. What happens during the procedure?      An IV will be inserted into one of your veins.  You will be given one or more of the following: ? A medicine to help you relax (sedative). ? A medicine to numb the area (local anesthetic). ? A medicine to make you fall asleep (general anesthetic). ? A medicine that is injected into an area of your body to numb everything below the injection site (regional anesthetic).  Your bladder may be emptied with a small tube (catheter).  If you have been given a general anesthetic, a tube will be put down your throat to help you breathe.  Two small incisions will be made in your lower abdomen and near your belly button.  Your abdomen will be inflated with a gas. This will let the surgeon  see better and will give the surgeon room to work.  A thin, lighted tube (laparoscope) with a camera attached will be inserted into your abdomen through one of the incisions. Small instruments will be inserted through the other incision.  The fallopian tubes will be tied off, burned (cauterized), or blocked with a clip, ring, or clamp. A small portion in the center of each fallopian tube may be removed.  The gas will be released from the abdomen.  The incisions will be closed with stitches (sutures).  A bandage (dressing) will be placed over the incisions. The procedure may vary among health care providers and hospitals. What happens after the procedure?  Your blood pressure, heart rate, breathing rate, and blood oxygen level will be monitored until you leave the hospital.  You will be given medicine to help with pain, nausea, and vomiting as needed. Summary  Laparoscopic tubal ligation is a procedure that is done so that you cannot get pregnant.  You should not have this procedure if you want to get pregnant someday or if you are unsure about having more children.  The procedure is done using a thin, lighted tube (laparoscope) with a camera attached that will be inserted into your abdomen through an incision.  Follow instructions from your health care provider about eating and drinking before the procedure. This information is not intended to replace advice given to you by your health care provider. Make sure you discuss any questions you have with your health care provider. Document Revised: 08/06/2018 Document Reviewed: 01/22/2018 Elsevier Patient Education  2020 [employer].

## 2020-03-19 LAB — HEPATITIS B SURFACE ANTIGEN: Hepatitis B Surface Ag: NEGATIVE

## 2020-03-19 LAB — HEPATITIS C ANTIBODY: Hep C Virus Ab: <0.1 s/co ratio (ref 0.0–0.9)

## 2020-03-24 ENCOUNTER — Other Ambulatory Visit: Payer: BC Managed Care – PPO

## 2020-03-24 ENCOUNTER — Encounter: Payer: Self-pay | Admitting: Obstetrics & Gynecology

## 2020-03-24 LAB — CYTOLOGY - PAP
Comment: NEGATIVE
Comment: NEGATIVE
Diagnosis: NEGATIVE
HPV 16: NEGATIVE
HPV 18 / 45: NEGATIVE
High risk HPV: POSITIVE — AB

## 2020-04-28 ENCOUNTER — Ambulatory Visit: Payer: BLUE CROSS/BLUE SHIELD

## 2020-06-09 ENCOUNTER — Encounter (HOSPITAL_BASED_OUTPATIENT_CLINIC_OR_DEPARTMENT_OTHER): Payer: Self-pay

## 2020-06-09 ENCOUNTER — Ambulatory Visit (HOSPITAL_BASED_OUTPATIENT_CLINIC_OR_DEPARTMENT_OTHER): Admit: 2020-06-09 | Payer: BC Managed Care – PPO | Admitting: Obstetrics & Gynecology

## 2020-06-09 SURGERY — SALPINGECTOMY, BILATERAL, LAPAROSCOPIC
Anesthesia: Choice | Laterality: Bilateral

## 2020-06-11 ENCOUNTER — Ambulatory Visit
Admission: RE | Admit: 2020-06-11 | Discharge: 2020-06-11 | Disposition: A | Discharge status: Home / Self Care | Payer: BC Managed Care – PPO | Source: Ambulatory Visit | Attending: Obstetrics & Gynecology | Admitting: Obstetrics & Gynecology

## 2020-06-11 ENCOUNTER — Other Ambulatory Visit: Payer: Self-pay

## 2020-06-11 DIAGNOSIS — Z1231 Encounter for screening mammogram for malignant neoplasm of breast: Secondary | ICD-10-CM

## 2020-06-15 ENCOUNTER — Other Ambulatory Visit: Payer: Self-pay | Admitting: Obstetrics & Gynecology

## 2020-06-15 DIAGNOSIS — R928 Other abnormal and inconclusive findings on diagnostic imaging of breast: Secondary | ICD-10-CM

## 2020-07-05 ENCOUNTER — Ambulatory Visit: Payer: BC Managed Care – PPO

## 2020-07-05 ENCOUNTER — Other Ambulatory Visit: Payer: Self-pay

## 2020-07-05 ENCOUNTER — Ambulatory Visit
Admission: RE | Admit: 2020-07-05 | Discharge: 2020-07-05 | Disposition: A | Discharge status: Home / Self Care | Payer: BC Managed Care – PPO | Source: Ambulatory Visit | Attending: Obstetrics & Gynecology | Admitting: Obstetrics & Gynecology

## 2020-07-05 DIAGNOSIS — R928 Other abnormal and inconclusive findings on diagnostic imaging of breast: Secondary | ICD-10-CM

## 2020-09-07 ENCOUNTER — Ambulatory Visit: Payer: BC Managed Care – PPO | Admitting: Obstetrics & Gynecology

## 2020-09-27 ENCOUNTER — Ambulatory Visit (INDEPENDENT_AMBULATORY_CARE_PROVIDER_SITE_OTHER): Payer: BC Managed Care – PPO | Admitting: Obstetrics & Gynecology

## 2020-09-27 ENCOUNTER — Other Ambulatory Visit: Payer: Self-pay

## 2020-09-27 ENCOUNTER — Encounter: Payer: Self-pay | Admitting: Obstetrics & Gynecology

## 2020-09-27 VITALS — BP 147/84 | HR 84

## 2020-09-27 DIAGNOSIS — Z30430 Encounter for insertion of intrauterine contraceptive device: Secondary | ICD-10-CM

## 2020-09-27 MED ORDER — LEVONORGESTREL 20 MCG/DAY IU IUD
1.0000 | INTRAUTERINE_SYSTEM | Freq: Once | INTRAUTERINE | Status: AC
  Administered 2020-09-27: 1 via INTRAUTERINE

## 2020-09-27 NOTE — Patient Instructions (Signed)
Intrauterine Device Insertion, Care After This sheet gives you information about how to care for yourself after your procedure. Your health care provider may also give you more specific instructions. If you have problems or questions, contact your health care provider. What can I expect after the procedure? After the procedure, it is common to have: Cramps and pain in the abdomen. Bleeding. It may be light or heavy. This may last for a few days. Lower back pain. Dizziness. Headaches. Nausea. Follow these instructions at home:  Before resuming sexual activity, check to make sure that you can feel the IUD string or strings. You should be able to feel the end of the string below the opening of your cervix. If your IUD string is in place, you may resume sexual activity. If you had a hormonal IUD inserted more than 7 days after your most recent period started, you will need to use a backup method of birth control for 7 days after IUD insertion. Ask your health care provider whether this applies to you. Continue to check that the IUD is still in place by feeling for the strings after every menstrual period, or once a month. An IUD will not protect you from sexually transmitted infections (STIs). Use methods to prevent the exchange of body fluids between partners (barrier protection) every time you have sex. Barrier protection can be used during oral, vaginal, or anal sex. Commonly used barrier methods include: Female condom. Female condom. Dental dam. Take over-the-counter and prescription medicines only as told by your health care provider. Keep all follow-up visits as told by your health care provider. This is important. Contact a health care provider if: You feel light-headed or weak. You have any of the following problems with your IUD string or strings: The string bothers or hurts you or your sexual partner. You cannot feel the string. The string has gotten longer. You can feel the IUD in  your vagina. You think you may be pregnant, or you miss your menstrual period. You think you may have a sexually transmitted infection (STI). Get help right away if: You have flu-like symptoms, such as tiredness (fatigue) and muscle aches. You have a fever and chills. You have bleeding that is heavier or lasts longer than a normal menstrual cycle. You have abnormal or bad-smelling discharge from your vagina. You develop abdominal pain that is new, is getting worse, or is not in the same area of earlier cramping and pain. You have pain during sexual activity. Summary After the procedure, it is common to have cramps and pain in the abdomen. It is also common to have light bleeding or heavier bleeding that is like your menstrual period. Continue to check that the IUD is still in place by feeling for the strings after every menstrual period, or once a month. Keep all follow-up visits as told by your health care provider. This is important. Contact your health care provider if you have problems with your IUD strings, such as the string getting longer or bothering you or your sexual partner. This information is not intended to replace advice given to you by your health care provider. Make sure you discuss any questions you have with your health care provider. Document Revised: 02/18/2019 Document Reviewed: 02/18/2019 Elsevier Patient Education  2022 Elsevier Inc.  

## 2020-09-27 NOTE — Progress Notes (Signed)
    GYNECOLOGY OFFICE PROCEDURE NOTE  Meredith Cochran is a 45 y.o. [id number] here for Mirena IUD insertion. No GYN concerns.  Last pap smear was on 03/18/2020 and had normal cytology, positive HRHPV (neg 16, 18/45). Normal mammogram in 07/05/2020.  IUD Insertion Procedure Note Patient identified, informed consent performed, consent signed.   Discussed risks of irregular bleeding, cramping, infection, malpositioning or misplacement of the IUD outside the uterus which may require further procedure such as laparoscopy. Also discussed >99% contraception efficacy, increased risk of ectopic pregnancy with failure of method.   Emphasized that this did not protect against STIs, condoms recommended during all sexual encounters. Time out was performed.  Urine pregnancy test negative.  Speculum placed in the vagina.  Cervix visualized.  Cleaned with Betadine x 2.  Grasped anteriorly with a single tooth tenaculum.  Uterus sounded to 9 cm.  Mirena IUD placed per manufacturer's recommendations.  Strings trimmed to 3 cm. Tenaculum was removed, good hemostasis noted.  Patient tolerated procedure well.   Patient was given post-procedure instructions.  She was advised to have backup contraception for one week.  Patient was also asked to check IUD strings periodically and follow up in 4 weeks for IUD check.   [name], MD, FACOG Obstetrician & Gynecologist, [facility] for [employer], [facility]

## 2020-11-22 ENCOUNTER — Encounter: Payer: Self-pay | Admitting: Obstetrics & Gynecology

## 2020-11-22 ENCOUNTER — Other Ambulatory Visit: Payer: Self-pay

## 2020-11-22 ENCOUNTER — Ambulatory Visit (INDEPENDENT_AMBULATORY_CARE_PROVIDER_SITE_OTHER): Payer: BC Managed Care – PPO | Admitting: Obstetrics & Gynecology

## 2020-11-22 VITALS — BP 130/78 | HR 83

## 2020-11-22 DIAGNOSIS — Z30431 Encounter for routine checking of intrauterine contraceptive device: Secondary | ICD-10-CM

## 2020-11-22 NOTE — Progress Notes (Signed)
    GYNECOLOGY OFFICE ENCOUNTER NOTE  History:  45 y.o. [id number] here today for today for IUD string check; Mirena  IUD was placed 09/27/2020. No complaints about the IUD, no concerning side effects.  The following portions of the patient's history were reviewed and updated as appropriate: allergies, current medications, past family history, past medical history, past social history, past surgical history and problem list.  Last pap smear on 03/18/2020 was normal, positive HRHPV but negative 16, 18/45.  Review of Systems:  Pertinent items are noted in HPI.   Objective:  Physical Exam Blood pressure 130/78, pulse 83. CONSTITUTIONAL: Well-developed, well-nourished female in no acute distress.  NEUROLOGIC: Alert and oriented to person, place, and time. Normal reflexes, muscle tone coordination.  PSYCHIATRIC: Normal mood and affect. Normal behavior. Normal judgment and thought content. CARDIOVASCULAR: Normal heart rate noted RESPIRATORY: Effort and breath sounds normal, no problems with respiration noted ABDOMEN: Soft, no distention noted.   PELVIC: Normal appearing external genitalia; normal appearing vaginal mucosa and cervix.  IUD strings visualized, about 3 cm in length outside cervix. Done in the presence of a chaperone.   Assessment & Plan:  Patient to keep IUD in place for up to eight years; can come in for removal earlier if she desires or for any concerning side effects.  Due for next pap in 03/2021.   [name], MD, FACOG Obstetrician & Gynecologist, [facility] for [employer], [facility]

## 2021-02-10 ENCOUNTER — Encounter: Payer: Self-pay | Admitting: Radiology

## 2021-03-22 ENCOUNTER — Other Ambulatory Visit: Payer: Self-pay

## 2021-03-22 ENCOUNTER — Ambulatory Visit (INDEPENDENT_AMBULATORY_CARE_PROVIDER_SITE_OTHER): Payer: BC Managed Care – PPO | Admitting: Obstetrics & Gynecology

## 2021-03-22 ENCOUNTER — Other Ambulatory Visit (HOSPITAL_COMMUNITY)
Admission: RE | Admit: 2021-03-22 | Discharge: 2021-03-22 | Disposition: A | Discharge status: Home / Self Care | Payer: BC Managed Care – PPO | Source: Ambulatory Visit | Attending: Obstetrics & Gynecology | Admitting: Obstetrics & Gynecology

## 2021-03-22 ENCOUNTER — Encounter: Payer: Self-pay | Admitting: Obstetrics & Gynecology

## 2021-03-22 VITALS — BP 136/73 | HR 88 | Wt 217.0 lb

## 2021-03-22 DIAGNOSIS — N898 Other specified noninflammatory disorders of vagina: Secondary | ICD-10-CM | POA: Diagnosis present

## 2021-03-22 DIAGNOSIS — N9089 Other specified noninflammatory disorders of vulva and perineum: Secondary | ICD-10-CM

## 2021-03-22 DIAGNOSIS — B3731 Acute candidiasis of vulva and vagina: Secondary | ICD-10-CM

## 2021-03-22 DIAGNOSIS — Z113 Encounter for screening for infections with a predominantly sexual mode of transmission: Secondary | ICD-10-CM | POA: Insufficient documentation

## 2021-03-22 DIAGNOSIS — Z3009 Encounter for other general counseling and advice on contraception: Secondary | ICD-10-CM | POA: Diagnosis not present

## 2021-03-22 NOTE — Progress Notes (Signed)
GYNECOLOGY OFFICE VISIT NOTE  History:   Meredith Cochran is a 46 y.o. [id number] here today for discussion about having a tubal ligation.  She self-discontinued her Mirena a few weeks ago, reported having pain and abnormal bleeding that resolved after removal. Patients wants permanent sterilization.  Also desires STI screen, had unprotected intercourse. Also has a pimple on her labia she wants to be get evaluated, no pain or other symptoms.  Also reports abnormal vaginal discharge occasionally.  She denies any abnormal bleeding, pelvic pain or other concerns.    Past Medical History:  Diagnosis Date   Asthma    triggered by cold   Bipolar disorder (HCC)    Depression    GERD (gastroesophageal reflux disease)    History of hiatal hernia    Tremor    states left hand on occasion"from some of the psychotic meds in the past"    Past Surgical History:  Procedure Laterality Date   LAPAROSCOPIC GASTRIC SLEEVE RESECTION WITH HIATAL HERNIA REPAIR N/A 05/18/2014   Procedure: LAPAROSCOPIC GASTRIC SLEEVE RESECTION WITH HIATAL HERNIA REPAIR;  Surgeon: Gaynelle Adu, MD;  Location: [facility];  Service: General;  Laterality: N/A;   NO PAST SURGERIES     UPPER GI ENDOSCOPY N/A 05/18/2014   Procedure: UPPER GI ENDOSCOPY;  Surgeon: Gaynelle Adu, MD;  Location: [facility];  Service: General;  Laterality: N/A;    The following portions of the patient's history were reviewed and updated as appropriate: allergies, current medications, past family history, past medical history, past social history, past surgical history and problem list.   Health Maintenance:  Normal pap and positive HRHPV on 03/18/2020.  Normal mammogram on 07/05/2020.   Review of Systems:  Pertinent items noted in HPI and remainder of comprehensive ROS otherwise negative.  Physical Exam:  BP 136/73    Pulse 88    Wt 217 lb (98.4 kg)    LMP 03/03/2021 (Exact Date)    BMI 32.99 kg/m  CONSTITUTIONAL: Well-developed, well-nourished female in no acute  distress.  HEENT:  Normocephalic, atraumatic. External right and left ear normal. No scleral icterus.  NECK: Normal range of motion, supple, no masses noted on observation SKIN: No rash noted. Not diaphoretic. No erythema. No pallor. MUSCULOSKELETAL: Normal range of motion. No edema noted. NEUROLOGIC: Alert and oriented to person, place, and time. Normal muscle tone coordination. No cranial nerve deficit noted. PSYCHIATRIC: Normal mood and affect. Normal behavior. Normal judgment and thought content. CARDIOVASCULAR: Normal heart rate noted RESPIRATORY: Effort and breath sounds normal, no problems with respiration noted ABDOMEN: No masses noted. No other overt distention noted.   PELVIC: Normal appearing external genitalia with tiny pimple noted on left labium majus without erythema, induration or tenderness to palpation; normal urethral meatus; normal appearing distal vaginal mucosa.  White discharge noted, testing sample obtained.   Performed in the presence of a chaperone     Assessment and Plan:     1. Consultation for sterilization Patient desires permanent sterilization.  Other reversible forms of contraception (over the counter/barrier methods; hormonal contraceptives including pill, patch, ring, Depo-Provera injection, Nexplanon implant; hormonal IUDs Skyla and Mirena; nonhormonal copper IUD Paragard) were discussed with patient; she declined all these modalities. She was given the choice between laparoscopic bilateral tubal sterilization using Filshie clips or laparoscopic bilateral salpingectomy. For the Filshie clip sterilization, she was told she will have one incision in her umbilicus.  Failure risk of 1-2 % with increased risk of ectopic gestation if pregnancy occurs was also  discussed with patient. For the bilateral salpingectomy, she was told that both tubes will be resected via three small incisions; the failure risk of less than 1%.  Any future pregnancies will have to be attempted  via IVF or other fertility procedures.  Reiterated permanence and irreversibility of both procedures; in the case of Filshie clip application, attempts to reverse tubal sterilization are often not successful.  Also emphasized risk of regret which is noted more in patients less than the age of 53.  All questions were answered. She desires laparoscopic bilateral sterilization with Filshie clips.  Other risks of the procedure were discussed with patient including but not limited to: bleeding, infection, injury to surrounding organs and need for additional procedures.  Also discussed possibility of post-tubal syndrome with increased pelvic pain or menstrual irregularities. Patient verbalized understanding of these risks and wants to proceed with this procedure.  She was told that she will be contacted by our surgical scheduler regarding the time and date of her surgery; routine preoperative instructions will be given to her  She was told she may be called for a preoperative appointment about a week prior to surgery and will be given further preoperative instructions at that visit. Printed patient education handouts about the procedure were given to the patient to review at home.   2. Routine screening for STI (sexually transmitted infection) STI screen done, will follow up results and manage accordingly. Safe sex recommended at all times. - Cervicovaginal ancillary only( [place]) - RPR+HBsAg+HCVAb+HIV  3. Vaginal discharge - Cervicovaginal ancillary only( [place]) done, will follow up results and manage accordingly.  4. Vulvar lesion Reassured about benign vulvar pimple. No intervention needed  Routine preventative health maintenance measures emphasized. Please refer to After Visit Summary for other counseling recommendations.   Return in about 1 month (around 04/22/2021) for Annual and pap smear (had positive HPV in 2022).    I spent 30 minutes dedicated to the care of this patient  including pre-visit review of records, face to face time with the patient discussing her conditions and treatments, surgical counseling and post visit orders.    [name], MD, FACOG Obstetrician & Gynecologist, [facility] for [employer], [facility]

## 2021-03-23 ENCOUNTER — Encounter: Payer: Self-pay | Admitting: Obstetrics & Gynecology

## 2021-03-23 LAB — RPR+HBSAG+HCVAB+...
HIV Screen 4th Generation wRfx: NONREACTIVE
Hep C Virus Ab: <0.1 s/co ratio (ref 0.0–0.9)
Hepatitis B Surface Ag: NEGATIVE
RPR Ser Ql: NONREACTIVE

## 2021-03-23 LAB — CERVICOVAGINAL ANCILLARY ONLY
Bacterial Vaginitis (gardnerella): NEGATIVE
Candida Glabrata: NEGATIVE
Candida Vaginitis: POSITIVE — AB
Chlamydia: NEGATIVE
Comment: NEGATIVE
Comment: NEGATIVE
Comment: NEGATIVE
Comment: NEGATIVE
Comment: NEGATIVE
Comment: NORMAL
Neisseria Gonorrhea: NEGATIVE
Trichomonas: NEGATIVE

## 2021-03-23 MED ORDER — FLUCONAZOLE 150 MG PO TABS: 150.0000 mg | ORAL_TABLET | Freq: Once | ORAL | 3 refills | Status: DC

## 2021-03-23 NOTE — Addendum Note (Signed)
Addended by: [name] A on: 03/23/2021 06:48 PM   Modules accepted: Orders

## 2021-03-30 ENCOUNTER — Telehealth: Payer: Self-pay

## 2021-03-30 NOTE — Telephone Encounter (Signed)
Called patient to discuss potential surgery date and times, patient opted for 02/22.

## 2021-04-18 ENCOUNTER — Ambulatory Visit (INDEPENDENT_AMBULATORY_CARE_PROVIDER_SITE_OTHER): Payer: BC Managed Care – PPO | Admitting: Obstetrics & Gynecology

## 2021-04-18 ENCOUNTER — Encounter: Payer: Self-pay | Admitting: Obstetrics & Gynecology

## 2021-04-18 ENCOUNTER — Other Ambulatory Visit: Payer: Self-pay

## 2021-04-18 ENCOUNTER — Other Ambulatory Visit (HOSPITAL_COMMUNITY)
Admission: RE | Admit: 2021-04-18 | Discharge: 2021-04-18 | Disposition: A | Discharge status: Home / Self Care | Payer: BC Managed Care – PPO | Source: Ambulatory Visit | Attending: Obstetrics & Gynecology | Admitting: Obstetrics & Gynecology

## 2021-04-18 VITALS — BP 154/82 | HR 87 | Ht 68.0 in | Wt 209.0 lb

## 2021-04-18 DIAGNOSIS — N760 Acute vaginitis: Secondary | ICD-10-CM

## 2021-04-18 DIAGNOSIS — Z1231 Encounter for screening mammogram for malignant neoplasm of breast: Secondary | ICD-10-CM | POA: Diagnosis not present

## 2021-04-18 DIAGNOSIS — B3731 Acute candidiasis of vulva and vagina: Secondary | ICD-10-CM | POA: Diagnosis not present

## 2021-04-18 DIAGNOSIS — B9689 Other specified bacterial agents as the cause of diseases classified elsewhere: Secondary | ICD-10-CM

## 2021-04-18 DIAGNOSIS — N898 Other specified noninflammatory disorders of vagina: Secondary | ICD-10-CM | POA: Diagnosis not present

## 2021-04-18 DIAGNOSIS — Z01419 Encounter for gynecological examination (general) (routine) without abnormal findings: Secondary | ICD-10-CM | POA: Diagnosis not present

## 2021-04-18 DIAGNOSIS — Z23 Encounter for immunization: Secondary | ICD-10-CM | POA: Diagnosis not present

## 2021-04-18 NOTE — Progress Notes (Signed)
GYNECOLOGY ANNUAL PREVENTATIVE CARE ENCOUNTER NOTE  History:     Meredith Cochran is a 46 y.o. [id number] female here for a routine annual gynecologic exam.  Current complaints: abnormal vaginal discharge and itching for a few days, desires evaluation.  Also desires HPV vaccine, committed to getting entire series.  Scheduled for laparoscopic tubal ligation on 05/04/2021.   Denies abnormal vaginal bleeding, discharge, pelvic pain, problems with intercourse or other gynecologic concerns.    Gynecologic History Patient's last menstrual period was 03/28/2021. Contraception: condoms, scheduled for BTL soon Last Pap: 03/18/2020. Result was normal with positive HPV Last Mammogram: 07/05/2020.  Result was normal   Obstetric History OB History  Gravida Para Term Preterm AB Living  3 2 2   1 2   SAB IAB Ectopic Multiple Live Births    1     2    # Outcome Date GA Lbr Len/2nd Weight Sex Delivery Anes PTL Lv  3 IAB 2009          2 Term 05/05/05    M Vag-Spont None  LIV  1 Term 08/16/00    F Vag-Spont None  LIV    Obstetric Comments  Unsure of dating     Past Medical History:  Diagnosis Date   Asthma    triggered by cold   Bipolar disorder ([place])    Depression    GERD (gastroesophageal reflux disease)    History of hiatal hernia    Tremor    states left hand on occasion"from some of the psychotic meds in the past"    Past Surgical History:  Procedure Laterality Date   LAPAROSCOPIC GASTRIC SLEEVE RESECTION WITH HIATAL HERNIA REPAIR N/A 05/18/2014   Procedure: LAPAROSCOPIC GASTRIC SLEEVE RESECTION WITH HIATAL HERNIA REPAIR;  Surgeon: Greer Pickerel, MD;  Location: [facility];  Service: General;  Laterality: N/A;   NO PAST SURGERIES     UPPER GI ENDOSCOPY N/A 05/18/2014   Procedure: UPPER GI ENDOSCOPY;  Surgeon: Greer Pickerel, MD;  Location: [facility];  Service: General;  Laterality: N/A;    Current Outpatient Medications on File Prior to Visit  Medication Sig Dispense Refill    amphetamine-dextroamphetamine (ADDERALL) 15 MG tablet Take 15 mg by mouth every morning.      CAPLYTA 42 MG capsule Take 42 mg by mouth at bedtime.     clonazePAM (KLONOPIN) 1 MG tablet Take 1 mg by mouth 2 (two) times daily as needed for anxiety.      lamoTRIgine (LAMICTAL) 200 MG tablet Take 400 mg by mouth every morning.      Multiple Vitamins-Minerals (MULTIVITAMIN & MINERAL PO) Take 1 tablet by mouth 4 (four) times daily. Beriatric (Fusion) chewable.     QUEtiapine (SEROQUEL) 400 MG tablet Take 400 mg by mouth at bedtime.     No current facility-administered medications on file prior to visit.    No Known Allergies  Social History:  reports that she quit smoking about 10 years ago. Her smoking use included cigarettes. She has a 1.00 pack-year smoking history. She has never used smokeless tobacco. She reports current alcohol use. She reports that she does not use drugs.  Family History  Problem Relation Age of Onset   Breast cancer Paternal Aunt 9   Breast cancer Paternal Grandmother 36   Breast cancer Paternal Aunt 27    The following portions of the patient's history were reviewed and updated as appropriate: allergies, current medications, past family history, past medical history, past social history, past  surgical history and problem list.  Review of Systems Pertinent items noted in HPI and remainder of comprehensive ROS otherwise negative.  Physical Exam:  BP (!) 154/82    Pulse 87    Ht 5\' 8"  (1.727 m)    Wt 209 lb (94.8 kg)    LMP 03/28/2021    BMI 31.78 kg/m  CONSTITUTIONAL: Well-developed, well-nourished female in no acute distress.  HENT:  Normocephalic, atraumatic, External right and left ear normal.  EYES: Conjunctivae and EOM are normal. Pupils are equal, round, and reactive to light. No scleral icterus.  NECK: Normal range of motion, supple, no masses.  Normal thyroid.  SKIN: Skin is warm and dry. No rash noted. Not diaphoretic. No erythema. No  pallor. MUSCULOSKELETAL: Normal range of motion. No tenderness.  No cyanosis, clubbing, or edema. NEUROLOGIC: Alert and oriented to person, place, and time. Normal reflexes, muscle tone coordination.  PSYCHIATRIC: Normal mood and affect. Normal behavior. Normal judgment and thought content. CARDIOVASCULAR: Normal heart rate noted, regular rhythm RESPIRATORY: Clear to auscultation bilaterally. Effort and breath sounds normal, no problems with respiration noted. BREASTS: Symmetric in size. No masses, tenderness, skin changes, nipple drainage, or lymphadenopathy bilaterally. Performed in the presence of a chaperone. ABDOMEN: Soft, no distention noted.  No tenderness, rebound or guarding.  PELVIC: Normal appearing external genitalia and urethral meatus; normal appearing vaginal mucosa and cervix.  Thick white vaginal discharge noted, testing sample obtained.  Pap smear obtained.  Normal uterine size, no other palpable masses, no uterine or adnexal tenderness.  Performed in the presence of a chaperone.   Assessment and Plan:    1. Vaginal discharge - Cervicovaginal ancillary only( [name]) done, will follow up results and manage accordingly.  2. Breast cancer screening by mammogram Mammogram to be scheduled - MM 3D SCREEN BREAST BILATERAL; Future  3. Vaccine for human papilloma virus (HPV) types 6, 11, 16, and 18 administered HPV vaccine series started, will return in 2 and 6 months for other vaccines. - HPV 9-valent vaccine,Recombinat  4. Well woman exam with routine gynecological exam - Cytology - PAP Will follow up results of pap smear and manage accordingly. Routine preventative health maintenance measures emphasized. Please refer to After Visit Summary for other counseling recommendations.      Meredith Schneiders, MD, [place] for [employer], [place]

## 2021-04-20 LAB — CERVICOVAGINAL ANCILLARY ONLY
Bacterial Vaginitis (gardnerella): POSITIVE — AB
Candida Glabrata: NEGATIVE
Candida Vaginitis: POSITIVE — AB
Chlamydia: NEGATIVE
Comment: NEGATIVE
Comment: NEGATIVE
Comment: NEGATIVE
Comment: NEGATIVE
Comment: NEGATIVE
Comment: NORMAL
Neisseria Gonorrhea: NEGATIVE
Trichomonas: NEGATIVE

## 2021-04-20 MED ORDER — FLUCONAZOLE 150 MG PO TABS: 150.0000 mg | ORAL_TABLET | Freq: Once | ORAL | 3 refills | Status: AC

## 2021-04-20 MED ORDER — METRONIDAZOLE 500 MG PO TABS: 500.0000 mg | ORAL_TABLET | Freq: Two times a day (BID) | ORAL | 0 refills | Status: AC

## 2021-04-20 NOTE — Addendum Note (Signed)
Addended by: [name] A on: 04/20/2021 02:05 PM   Modules accepted: Orders

## 2021-04-25 ENCOUNTER — Encounter: Payer: Self-pay | Admitting: Obstetrics & Gynecology

## 2021-04-25 ENCOUNTER — Telehealth: Payer: Self-pay | Admitting: *Deleted

## 2021-04-25 DIAGNOSIS — R87810 Cervical high risk human papillomavirus (HPV) DNA test positive: Secondary | ICD-10-CM | POA: Insufficient documentation

## 2021-04-25 LAB — CYTOLOGY - PAP
Comment: NEGATIVE
Comment: NEGATIVE
Diagnosis: UNDETERMINED — AB
HPV 16: NEGATIVE
HPV 18 / 45: NEGATIVE
High risk HPV: POSITIVE — AB

## 2021-04-25 NOTE — Telephone Encounter (Signed)
-----   Message from Tereso Newcomer, MD sent at 04/25/2021 11:49 AM EST ----- Please schedule patient for colposcopy for ASCUS +HRHPV pap done on 04/18/2021. Please call to inform patient of results and need for appointment.

## 2021-04-25 NOTE — Telephone Encounter (Signed)
Pt informed of pap results and will schedule colpo

## 2021-04-28 NOTE — Progress Notes (Signed)
Spoke with patient in regards to upcoming surgery scheduled 05/04/21. Patient stated she needed to cancel the surgery. Advised patient that she would need to call Dr Mont Dutton office to cancel or re-schedule to surgery. Patient verbalized understanding.

## 2021-05-02 NOTE — Progress Notes (Addendum)
Left patient voice mail message to call dr Rennis Harding office if cancelling 05-04-2021 surgery and to call [phone]  and speak to pre op RN if having 05-04-2021 surgery. Message sent via epic inbasket to dr Macon Large and  [place] battle pt stated on 04-28-2021 will cancel 05-04-2021 surgery.

## 2021-05-04 ENCOUNTER — Ambulatory Visit (HOSPITAL_BASED_OUTPATIENT_CLINIC_OR_DEPARTMENT_OTHER)
Admission: RE | Admit: 2021-05-04 | Payer: BC Managed Care – PPO | Source: Home / Self Care | Admitting: Obstetrics & Gynecology

## 2021-05-04 DIAGNOSIS — Z302 Encounter for sterilization: Secondary | ICD-10-CM

## 2021-05-04 SURGERY — LIGATION, FALLOPIAN TUBE, LAPAROSCOPIC
Anesthesia: Choice | Laterality: Bilateral

## 2021-05-20 ENCOUNTER — Other Ambulatory Visit: Payer: Self-pay | Admitting: Obstetrics & Gynecology

## 2021-05-20 DIAGNOSIS — Z1231 Encounter for screening mammogram for malignant neoplasm of breast: Secondary | ICD-10-CM

## 2021-05-31 ENCOUNTER — Other Ambulatory Visit (HOSPITAL_COMMUNITY)
Admission: RE | Admit: 2021-05-31 | Discharge: 2021-05-31 | Disposition: A | Discharge status: Home / Self Care | Payer: BC Managed Care – PPO | Source: Ambulatory Visit | Attending: Obstetrics & Gynecology | Admitting: Obstetrics & Gynecology

## 2021-05-31 ENCOUNTER — Other Ambulatory Visit: Payer: Self-pay

## 2021-05-31 ENCOUNTER — Ambulatory Visit (INDEPENDENT_AMBULATORY_CARE_PROVIDER_SITE_OTHER): Payer: BC Managed Care – PPO | Admitting: *Deleted

## 2021-05-31 DIAGNOSIS — Z202 Contact with and (suspected) exposure to infections with a predominantly sexual mode of transmission: Secondary | ICD-10-CM

## 2021-05-31 DIAGNOSIS — Z113 Encounter for screening for infections with a predominantly sexual mode of transmission: Secondary | ICD-10-CM | POA: Insufficient documentation

## 2021-05-31 MED ORDER — CEFTRIAXONE SODIUM 500 MG IJ SOLR
500.0000 mg | Freq: Once | INTRAMUSCULAR | Status: AC
Start: 2021-05-31 — End: 2021-05-31
  Administered 2021-05-31: 500 mg via INTRAMUSCULAR

## 2021-05-31 NOTE — Progress Notes (Signed)
SUBJECTIVE:  ?46 y.o. female who desires a STI screen. ?Denies abnormal vaginal discharge, bleeding or significant pelvic pain. No UTI symptoms. Has known exposure to gonorrhea.  ? ?No LMP recorded. ? ?OBJECTIVE:  ?She appears well. ? ? ?ASSESSMENT:  ?STI Screen  ? ?PLAN:  ?Pt offered STI blood screening- labs done in Feb 2023. ?GC, chlamydia, and trichomonas probe sent to lab. Oral GC/chlamydia swab sent as well.  ?Treatment: Rocephin 500mg  given. ? ?Pt follow up as needed.  ? ? , RN  ?

## 2021-06-02 ENCOUNTER — Encounter: Payer: Self-pay | Admitting: Obstetrics & Gynecology

## 2021-06-02 LAB — CERVICOVAGINAL ANCILLARY ONLY
Chlamydia: NEGATIVE
Comment: NEGATIVE
Comment: NEGATIVE
Comment: NORMAL
Neisseria Gonorrhea: POSITIVE — AB
Trichomonas: NEGATIVE

## 2021-06-02 LAB — GC/CHLAMYDIA PROBE AMP (~~LOC~~) NOT AT ARMC
Chlamydia: NEGATIVE
Comment: NEGATIVE
Comment: NORMAL
Neisseria Gonorrhea: POSITIVE — AB

## 2021-06-03 ENCOUNTER — Telehealth: Payer: Self-pay

## 2021-06-03 ENCOUNTER — Encounter: Payer: Self-pay | Admitting: Obstetrics & Gynecology

## 2021-06-03 DIAGNOSIS — A549 Gonococcal infection, unspecified: Secondary | ICD-10-CM

## 2021-06-03 HISTORY — DX: Gonococcal infection, unspecified: A54.9

## 2021-06-03 NOTE — Telephone Encounter (Signed)
[facility] pt sent Mychart message to [facility] regarding +GC results.  ?Informed pt to contact their office today for treatment.  ?Pt reports she was treated the same day she was tested but she has questions.  ?Questions answered; pt agrees to contact her office for TOC appt.  ?

## 2021-06-07 ENCOUNTER — Encounter: Payer: Self-pay | Admitting: Obstetrics & Gynecology

## 2021-06-07 ENCOUNTER — Ambulatory Visit
Admission: RE | Admit: 2021-06-07 | Discharge: 2021-06-07 | Disposition: A | Discharge status: Home / Self Care | Payer: BC Managed Care – PPO | Source: Ambulatory Visit | Attending: Obstetrics & Gynecology | Admitting: Obstetrics & Gynecology

## 2021-06-07 ENCOUNTER — Ambulatory Visit: Payer: BC Managed Care – PPO | Admitting: Obstetrics & Gynecology

## 2021-06-07 ENCOUNTER — Encounter: Payer: BC Managed Care – PPO | Admitting: Obstetrics & Gynecology

## 2021-06-07 DIAGNOSIS — Z1231 Encounter for screening mammogram for malignant neoplasm of breast: Secondary | ICD-10-CM

## 2021-06-16 ENCOUNTER — Ambulatory Visit (INDEPENDENT_AMBULATORY_CARE_PROVIDER_SITE_OTHER): Payer: BC Managed Care – PPO | Admitting: *Deleted

## 2021-06-16 VITALS — BP 111/71 | HR 82 | Wt 210.6 lb

## 2021-06-16 DIAGNOSIS — Z23 Encounter for immunization: Secondary | ICD-10-CM | POA: Diagnosis not present

## 2021-06-16 NOTE — Progress Notes (Signed)
Patient here today for second Gardisil vaccine. Vaccine administered without issue. Patient denies any concerns or questions. Next vaccine to be administered 6 months from first dose- 10/16/21. Patient verbalized understanding.  ? ?Paulina Fusi, RN ?06/16/21 ?

## 2021-07-05 ENCOUNTER — Encounter: Payer: Self-pay | Admitting: Obstetrics & Gynecology

## 2021-07-05 ENCOUNTER — Other Ambulatory Visit: Payer: Self-pay | Admitting: Obstetrics & Gynecology

## 2021-07-05 ENCOUNTER — Ambulatory Visit (INDEPENDENT_AMBULATORY_CARE_PROVIDER_SITE_OTHER): Payer: BC Managed Care – PPO | Admitting: Obstetrics & Gynecology

## 2021-07-05 VITALS — BP 135/82 | HR 73 | Wt 207.0 lb

## 2021-07-05 DIAGNOSIS — R87810 Cervical high risk human papillomavirus (HPV) DNA test positive: Secondary | ICD-10-CM

## 2021-07-05 DIAGNOSIS — R87610 Atypical squamous cells of undetermined significance on cytologic smear of cervix (ASC-US): Secondary | ICD-10-CM

## 2021-07-05 LAB — POCT URINE PREGNANCY: Preg Test, Ur: NEGATIVE

## 2021-07-05 NOTE — Progress Notes (Signed)
? ? ?  GYNECOLOGY OFFICE COLPOSCOPY PROCEDURE NOTE ? ?46 y.o. [id number] here for colposcopy for ASCUS with POSITIVE high risk HPV pap smear on 04/18/2021. Discussed role for HPV in cervical dysplasia, need for surveillance. ? ?Patient gave informed written consent, time out was performed.  Placed in lithotomy position. Cervix viewed with speculum and colposcope after application of acetic acid.  ? ?Colposcopy adequate? Yes ?Acetowhite lesions noted at 1 and 11 o'clock; corresponding biopsies obtained.  ECC specimen obtained. ?All specimens were labeled and sent to pathology. ? ?Chaperone was present during entire procedure. ? ?Patient was given post procedure instructions.  Will follow up pathology and manage accordingly; patient will be contacted with results and recommendations.  Routine preventative health maintenance measures emphasized. ? ? ? ?[name], MD, FACOG ?Obstetrician Heritage manager, Faculty Practice ?Center for [employer], [facility] ?

## 2021-07-05 NOTE — Patient Instructions (Signed)
COLPOSCOPY POST-PROCEDURE INSTRUCTIONS  You may take Ibuprofen, Aleve or Tylenol for cramping if needed.  If Monsel's solution was used, you will have a black discharge.  Light bleeding is normal.  If bleeding is heavier than your period, please call.  Put nothing in your vagina until the bleeding or discharge stops (usually 2 or 3 days).  We will call you within one week with biopsy results or discuss the results at your follow-up appointment if needed.  

## 2021-07-11 ENCOUNTER — Encounter: Payer: Self-pay | Admitting: Obstetrics & Gynecology

## 2021-07-11 DIAGNOSIS — N871 Moderate cervical dysplasia: Secondary | ICD-10-CM | POA: Insufficient documentation

## 2021-07-11 NOTE — Progress Notes (Signed)
07/05/21 Colposcopy Pathology after ASCUS +HRHPV pap ?1. 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 ?-NO INVASIVE NEOPLASM IDENTIFIED. ?-The biopsies have been evaluated with Ki-67 and p16 immunostains. ?2. Endocervix, curettage ?- FRAGMENTED CERVICAL TRANSFORMATION ZONE MUCOSA WITH CIN-I (MILD SQUAMOUS DYSPLASIA; LOW GRADE SQUAMOUS INTRAEPITHELIAL LESION). ? ?Given high grade dysplasia, patient needs further management.  Please make appointment for her to discuss LEEP (preferred) vs cryotherapy. Please call to inform patient of results and recommendations. ? ? ?[name], MD ?

## 2021-07-11 NOTE — Progress Notes (Signed)
Yes!  And she cancelled it! ? ?Again, is she sure?  So unusual that our test a week ago was negative.Marland KitchenMarland Kitchen

## 2021-07-13 ENCOUNTER — Encounter: Payer: Self-pay | Admitting: Obstetrics & Gynecology

## 2021-07-13 ENCOUNTER — Telehealth: Payer: BC Managed Care – PPO | Admitting: Physician Assistant

## 2021-07-13 DIAGNOSIS — N898 Other specified noninflammatory disorders of vagina: Secondary | ICD-10-CM

## 2021-07-13 DIAGNOSIS — O26891 Other specified pregnancy related conditions, first trimester: Secondary | ICD-10-CM

## 2021-07-13 NOTE — Progress Notes (Signed)
For the safety of you and your child, I recommend a face to face office visit with a health care provider.  Many mothers need to take medicines during their pregnancy and while nursing.  Almost all medicines pass into the breast milk in small quantities.  Most are generally considered safe for a mother to take but some medicines must be avoided.  After reviewing your E-Visit request, I recommend that you consult your OB/GYN or pediatrician for medical advice in relation to your condition and prescription medications while pregnant or breastfeeding.  NOTE:  There will be NO CHARGE for this eVisit  If you are having a true medical emergency please call 911.    For an urgent face to face visit, [facility] has six urgent care centers for your convenience:     [facility] [place] Get Driving Directions [phone] [address] [place], [place] [address]    [facility] ([place]) Get Driving Directions [phone] [address] [place], [place] [address]  [facility] ([place]) Get Driving Directions [phone] [address] [place],  [place]  [address]  [facility] Get Driving Directions [phone] [address] [place], [place] [address]   [facility] [place] Get Driving Directions  [phone] [address] [place], [place] [address]    [facility] [place] Get Driving Directions [phone] [address] [place], [place] [address]  Your MyChart E-visit questionnaire answers were reviewed by a board certified advanced clinical practitioner to complete your personal care plan based on your specific symptoms.  Thank you for using e-Visits.   I provided 5 minutes of non face-to-face time during this encounter for chart review and documentation.   

## 2021-07-14 MED ORDER — METRONIDAZOLE 500 MG PO TABS: 500.0000 mg | ORAL_TABLET | Freq: Two times a day (BID) | ORAL | 0 refills | Status: DC

## 2021-07-18 ENCOUNTER — Encounter: Payer: Self-pay | Admitting: Obstetrics & Gynecology

## 2021-07-19 ENCOUNTER — Encounter (HOSPITAL_COMMUNITY): Payer: Self-pay | Admitting: Obstetrics and Gynecology

## 2021-07-19 ENCOUNTER — Inpatient Hospital Stay (HOSPITAL_COMMUNITY): Payer: BC Managed Care – PPO

## 2021-07-19 ENCOUNTER — Inpatient Hospital Stay (HOSPITAL_COMMUNITY)
Admission: AD | Admit: 2021-07-19 | Discharge: 2021-07-19 | Disposition: A | Discharge status: Home / Self Care | Payer: BC Managed Care – PPO | Attending: Obstetrics and Gynecology | Admitting: Obstetrics and Gynecology

## 2021-07-19 ENCOUNTER — Other Ambulatory Visit: Payer: Self-pay

## 2021-07-19 ENCOUNTER — Encounter: Payer: Self-pay | Admitting: Student

## 2021-07-19 DIAGNOSIS — O3680X0 Pregnancy with inconclusive fetal viability, not applicable or unspecified: Secondary | ICD-10-CM | POA: Diagnosis present

## 2021-07-19 DIAGNOSIS — O09529 Supervision of elderly multigravida, unspecified trimester: Secondary | ICD-10-CM | POA: Diagnosis not present

## 2021-07-19 DIAGNOSIS — O4690 Antepartum hemorrhage, unspecified, unspecified trimester: Secondary | ICD-10-CM | POA: Insufficient documentation

## 2021-07-19 DIAGNOSIS — Z3A00 Weeks of gestation of pregnancy not specified: Secondary | ICD-10-CM | POA: Insufficient documentation

## 2021-07-19 LAB — URINALYSIS, ROUTINE W REFLEX MICROSCOPIC

## 2021-07-19 LAB — URINALYSIS, MICROSCOPIC (REFLEX): RBC / HPF: >50 RBC/hpf (ref 0–5)

## 2021-07-19 LAB — COMPREHENSIVE METABOLIC PANEL
ALT: 15 U/L (ref 0–44)
AST: 20 U/L (ref 15–41)
Albumin: 3.9 g/dL (ref 3.5–5.0)
Alkaline Phosphatase: 49 U/L (ref 38–126)
Anion gap: 5 (ref 5–15)
BUN: 7 mg/dL (ref 6–20)
CO2: 23 mmol/L (ref 22–32)
Calcium: 9.1 mg/dL (ref 8.9–10.3)
Chloride: 111 mmol/L (ref 98–111)
Creatinine, Ser: 1.12 mg/dL — ABNORMAL HIGH (ref 0.44–1.00)
GFR, Estimated: >60 mL/min (ref >60)
Glucose, Bld: 86 mg/dL (ref 70–99)
Potassium: 4.2 mmol/L (ref 3.5–5.1)
Sodium: 139 mmol/L (ref 135–145)
Total Bilirubin: 0.8 mg/dL (ref 0.3–1.2)
Total Protein: 7 g/dL (ref 6.5–8.1)

## 2021-07-19 LAB — CBC
HCT: 35.9 % — ABNORMAL LOW (ref 36.0–46.0)
Hemoglobin: 11.9 g/dL — ABNORMAL LOW (ref 12.0–15.0)
MCH: 30.3 pg (ref 26.0–34.0)
MCHC: 33.1 g/dL (ref 30.0–36.0)
MCV: 91.3 fL (ref 80.0–100.0)
Platelets: 342 10*3/uL (ref 150–400)
RBC: 3.93 MIL/uL (ref 3.87–5.11)
RDW: 12.9 % (ref 11.5–15.5)
WBC: 6.7 10*3/uL (ref 4.0–10.5)
nRBC: 0 % (ref 0.0–0.2)

## 2021-07-19 LAB — WET PREP, GENITAL
Clue Cells Wet Prep HPF POC: NONE SEEN
Sperm: NONE SEEN
Trich, Wet Prep: NONE SEEN
WBC, Wet Prep HPF POC: <10 (ref <10)
Yeast Wet Prep HPF POC: NONE SEEN

## 2021-07-19 LAB — HCG, QUANTITATIVE, PREGNANCY: hCG, Beta Chain, Quant, S: 112 m[IU]/mL — ABNORMAL HIGH (ref <5)

## 2021-07-19 LAB — POCT PREGNANCY, URINE: Preg Test, Ur: POSITIVE — AB

## 2021-07-19 NOTE — MAU Provider Note (Addendum)
?History  ? 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 She presents to the MAU today with a chief complaint of vaginal bleeding. Urine pregnancy test in MAU is positive, and she reports 2 positive at home pregnancy tests. She reports that she had intercourse on Friday and that is when the bleeding started. She reports that the bleeding has been constant and worsening through yesterday which prompted her to seek care. She reports no abdominal pain, no urinary symptoms, denies fever, chills, nausea, vomiting, as well as discharge. She endorses vaginal bleeding, as well as diarrhea that she believes is unrelated. She is seen by her OB/GYN at [facility] for [facility]. Patient had a negative urine pregnancy on 4/25, she missed her period on 4/28, and had a positive home urine pregnancy test on 4/30.  ? ?CSN: [id number] ? ?Arrival date and time: 07/19/21 0904 ? ? Event Date/Time  ? First Provider Initiated Contact with Patient 07/19/21 1010   ?  ? ?Chief Complaint  ?Patient presents with  ? Vaginal Bleeding  ? ? ? ?Pertinent Gynecological History: ? ?Bleeding: Patient reports a trickle of blood that has been worsening since intercourse on Friday ?Sexually transmitted diseases: past history: Gonorrhea  ? ?Past Medical History:  ?Diagnosis Date  ? Asthma   ? triggered by cold  ? Bipolar disorder (HCC)   ? Depression   ? GERD (gastroesophageal reflux disease)   ? Gonorrhea 06/03/2021  ? Positive in vaginal and pharyngeal swabs>treated 05/31/21  ? History of hiatal hernia   ? Tremor   ? states left hand on occasion"from some of the psychotic meds in the past"  ? ? ?Past Surgical History:  ?Procedure Laterality Date  ? LAPAROSCOPIC GASTRIC SLEEVE RESECTION WITH HIATAL HERNIA REPAIR N/A 05/18/2014  ? Procedure: LAPAROSCOPIC GASTRIC SLEEVE RESECTION WITH HIATAL HERNIA REPAIR;  Surgeon: Gaynelle Adu, MD;  Location: [facility];  Service: General;  Laterality: N/A;  ? NO PAST SURGERIES    ? UPPER GI ENDOSCOPY N/A 05/18/2014  ? Procedure: UPPER GI ENDOSCOPY;  Surgeon: Gaynelle Adu, MD;  Location: [facility];  Service: General;  Laterality: N/A;  ? ? ?Family History  ?Problem Relation Age of Onset  ? Cancer Father   ?     Prostate  ? Breast cancer Maternal Aunt   ? Breast cancer Paternal Aunt 61  ? Breast cancer Paternal Aunt 53  ? Breast cancer Paternal Grandmother 78  ? ? ?Social History  ? ?Tobacco Use  ? Smoking status: Former  ?  Packs/day: 0.50  ?  Years: 2.00  ?  Pack years: 1.00  ?  Types: Cigarettes  ?  Quit date: 05/12/2010  ?  Years since quitting: 11.1  ? Smokeless tobacco: Never  ?Vaping Use  ? Vaping Use: Never used  ?Substance Use Topics  ? Alcohol use: Yes  ?  Alcohol/week: 0.0 standard drinks  ?  Comment:  glass of wine 3-4 x week  ? Drug use: Not Currently  ?  Types: Marijuana  ? ? ?Allergies: No Known Allergies ? ?Medications Prior to Admission  ?Medication Sig Dispense Refill Last Dose  ? busPIRone (BUSPAR) 10 MG tablet Take 10 mg by mouth 3 (three) times daily.   07/18/2021  ? CAPLYTA 42 MG capsule Take 42 mg by mouth at bedtime.   07/18/2021  ? lamoTRIgine (LAMICTAL) 200 MG tablet Take 400  mg by mouth every morning.    07/18/2021  ? QUEtiapine (SEROQUEL) 300 MG tablet Take 300 mg by mouth at bedtime.   07/19/2021  ? amphetamine-dextroamphetamine (ADDERALL) 15 MG tablet Take 15 mg by mouth every morning.      ? clonazePAM (KLONOPIN) 1 MG tablet Take 1 mg by mouth 2 (two) times daily as needed for anxiety.      ? metroNIDAZOLE (FLAGYL) 500 MG tablet Take 1 tablet (500 mg total) by mouth 2 (two) times daily. 14 tablet 0   ? ? ?Review of Systems  ?Constitutional:  Negative for chills and fever.  ?Gastrointestinal:  Positive for diarrhea. Negative for abdominal pain, nausea and vomiting.  ?     She believes that her diarrhea is an unrelated symptom. She is not currently having diarrhea.  ?Genitourinary:   Positive for vaginal bleeding. Negative for dysuria and vaginal discharge.  ?     Endorses seeing at least one clot in her vaginal bleeding.  ?All other systems reviewed and are negative. ? ?Physical Exam  ? ?Blood pressure 119/63, pulse 75, temperature 98.5 ?F (36.9 ?C), temperature source Oral, resp. rate 19, height 5\' 8"  (1.727 m), weight 91.7 kg, last menstrual period 06/10/2021, SpO2 100 %. ? ?Physical Exam ?HENT:  ?   Head: Normocephalic and atraumatic.  ?Cardiovascular:  ?   Pulses: Normal pulses.  ?   Heart sounds: Normal heart sounds.  ?Pulmonary:  ?   Effort: Pulmonary effort is normal.  ?   Breath sounds: Normal breath sounds.  ?Abdominal:  ?   Palpations: Abdomen is soft.  ?   Tenderness: There is no abdominal tenderness.  ?Skin: ?   General: Skin is warm and dry.  ?Neurological:  ?   Mental Status: She is alert.  ?Psychiatric:     ?   Mood and Affect: Mood normal.     ?   Behavior: Behavior normal.  ? ? ?MAU Course  ?Procedures ?Transvaginal and transabdominal ultrasound ?FINDINGS: ?Intrauterine gestational sac: Absent ?  ?Yolk sac:  N/A ?  ?Embryo:  N/A ?  ?Cardiac Activity: N/A ?  ?Heart Rate: N/A  bpm ?  ?MSD:   mm    w     d ?  ?CRL:    mm    w    d                  06/12/2021 EDC: ?  ?Subchorionic hemorrhage:  N/A ?  ?Maternal uterus/adnexae: ?  ?Uterus anteverted with small intramural leiomyoma at anterior upper ?uterus 17 x 14 x 15 mm. ?  ?Endometrial complex normal appearance without mass, fluid, or ?gestational sac. ?  ?RIGHT ovary normal size and morphology, 2.2 x 1.0 x 1.6 cm. ?  ?LEFT ovary normal size and morphology, 1.8 x 3.0 x 1.5 cm. ?  ?Trace free pelvic fluid. ?  ?No adnexal masses. ?  ?IMPRESSION: ?No intrauterine gestation identified. ?  ?Findings are consistent with pregnancy of unknown location. ?  ?Differential diagnosis includes early intrauterine pregnancy too ?early to visualize, spontaneous abortion, and ectopic pregnancy. ?  ?Serial quantitative beta HCG and or follow-up ultrasound  recommended ?to definitively exclude ectopic pregnancy. ? ?MDM ?Ultrasound demonstrated pregnancy of unknown location. Patient's HCG was 112. Because of this the patient will be [facility] have her beta HCG checked in two days.  ?Swabs for G/C: not back yet ?Wet prep: Negative ?CBC: hemoglobin 11.9, all other values  within normal limits ?CMP: Creatinine slightly increased, all other values within normal limits ?Beta HCG: 112 ?ABO/RH: O positive  ? ?Assessment and Plan  ?Pregnancy of unknown location  ? ?Ultrasound demonstrated pregnancy of unknown location. Patient's HCG was 112. Because of this the patient will be scheduled for an [facility] to have her beta HCG rechecked in two days.  ? ?Time was taken by the midwife to explain that if the patient's symptoms became worse or if her pain was increased or she started to bleed more she should return to the MAU. Time was also taken to explain to the patient the results of the ultrasound as well as the result of the beta HCG levels and what the possible outcomes of the change in those levels could indicate.  ? ?Time was taken to answer any questions and the patient was agreeable to this plan.  ? ?Leonard Downinganiel R [employer] PA-S student  ?07/19/2021, 10:13 AM  ? ? ? ?CNM attestation: ? ?I have seen and examined this patient and agree with above documentation in the PA's note.  ? ?Lennon AlstromDeanna Marie Swiss is a 46 y.o. [id number] at 4024w4d reporting vaginal bleeding. It is "typical" for her cycle; she is upset because she is worried she is having a miscarriage.  ? ?PE: ?Patient Vitals for the past 24 hrs: ? BP Temp Temp src Pulse Resp SpO2 Height Weight  ?07/19/21 1239 (!) 145/87 98.5 ?F (36.9 ?C) Oral 77 20 100 % -- --  ?07/19/21 0958 119/63 -- -- 75 19 100 % -- --  ?07/19/21 0923 136/78 98.5 ?F (36.9 ?C) Oral 82 18 99 % -- --  ?07/19/21 0917 -- -- -- -- -- -- 5\' 8"  (1.727 m) 91.7 kg  ? ?Gen: calm  comfortable, NAD ?Resp: normal effort, no distress ?Heart: Regular rate ?Abd: Soft, NT, gravid, S=D ?NEFG: normal external vaginal discharge; dark red blood in the vagina with no clots, os is pink with no lesions,

## 2021-07-19 NOTE — MAU Note (Signed)
Meredith Cochran is a 46 y.o. at Unknown here in MAU reporting: she began having VB on Friday after intercourse but has had spotting since the weekend.  States VB doesn't require a sanitary napkin, but spotting is worsening ?LMP: 06/10/2021 ?Onset of complaint: Friday ?Pain score: 0 ?Vitals:  ? 07/19/21 0923  ?BP: 136/78  ?Pulse: 82  ?Resp: 18  ?Temp: 98.5 ?F (36.9 ?C)  ?SpO2: 99%  ?   ?FHT:N/A ?Lab orders placed from triage:   UPT ?

## 2021-07-20 LAB — GC/CHLAMYDIA PROBE AMP (~~LOC~~) NOT AT ARMC
Chlamydia: NEGATIVE
Comment: NEGATIVE
Comment: NORMAL
Neisseria Gonorrhea: NEGATIVE

## 2021-07-20 LAB — ABO/RH: ABO/RH(D): O POS

## 2021-07-21 ENCOUNTER — Ambulatory Visit: Payer: BC Managed Care – PPO | Admitting: Obstetrics & Gynecology

## 2021-07-21 ENCOUNTER — Telehealth: Payer: Self-pay | Admitting: Lactation Services

## 2021-07-21 ENCOUNTER — Other Ambulatory Visit: Payer: BC Managed Care – PPO

## 2021-07-21 DIAGNOSIS — O3680X0 Pregnancy with inconclusive fetal viability, not applicable or unspecified: Secondary | ICD-10-CM

## 2021-07-21 LAB — BETA HCG QUANT (REF LAB): hCG Quant: 46 m[IU]/mL

## 2021-07-21 NOTE — Telephone Encounter (Signed)
Called and spoke with patient, she has been called by [place] at [facility]. She has follow up appointment for Beta Hcg Scheduled. She has no questions or concerns at this time.  ?

## 2021-07-21 NOTE — Progress Notes (Signed)
Unfortunately, patient has failing pregnancy of unknown anatomic location. HCG today 07/21/21 is 46, compared to 112 on 07/19/21.  Will need repeat HCG in a week to document continued trend and possible resolution; will need weekly HCG values until negative.  Pain and bleeding precautions should be reviewed, and please give her support about this failing pregnancy.  Appointment should be made after pregnancy to discuss future family planning/contraception,  and she still needs colposcopy for her 04/18/2021 ASCUS +HRHPV pap smear. ? ?Please call to inform patient of results and recommendations. ? ?[name], MD

## 2021-07-28 ENCOUNTER — Other Ambulatory Visit: Payer: BC Managed Care – PPO

## 2021-07-28 DIAGNOSIS — O3680X0 Pregnancy with inconclusive fetal viability, not applicable or unspecified: Secondary | ICD-10-CM

## 2021-07-29 ENCOUNTER — Encounter: Payer: Self-pay | Admitting: Obstetrics & Gynecology

## 2021-07-29 LAB — BETA HCG QUANT (REF LAB): hCG Quant: 5 m[IU]/mL

## 2021-08-04 ENCOUNTER — Encounter: Payer: Self-pay | Admitting: Obstetrics & Gynecology

## 2021-08-04 ENCOUNTER — Ambulatory Visit (INDEPENDENT_AMBULATORY_CARE_PROVIDER_SITE_OTHER): Payer: BC Managed Care – PPO | Admitting: Obstetrics & Gynecology

## 2021-08-04 VITALS — BP 135/78 | HR 82

## 2021-08-04 DIAGNOSIS — Z30017 Encounter for initial prescription of implantable subdermal contraceptive: Secondary | ICD-10-CM | POA: Diagnosis not present

## 2021-08-04 DIAGNOSIS — N871 Moderate cervical dysplasia: Secondary | ICD-10-CM

## 2021-08-04 MED ORDER — ETONOGESTREL 68 MG ~~LOC~~ IMPL
68.0000 mg | DRUG_IMPLANT | Freq: Once | SUBCUTANEOUS | Status: AC
  Administered 2021-08-04: 68 mg via SUBCUTANEOUS

## 2021-08-04 NOTE — Patient Instructions (Signed)
Nexplanon Instructions After Insertion  Keep bandage clean and dry for 24 hours  May use ice/Tylenol/Ibuprofen for soreness or pain  If you develop fever, drainage or increased warmth from incision site-contact office immediately   

## 2021-08-04 NOTE — Progress Notes (Signed)
     GYNECOLOGY OFFICE PROCEDURE NOTE  Meredith Cochran is a 46 y.o. [id number] here for Nexplanon insertion.  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  Patient is aware of need for LEEP, will be scheduled at the end of this procedure.  Patient also had a recent completed early miscarriage, doing well. Desires Nexplanon today for contraception.  No other gynecologic concerns.  Nexplanon Insertion Procedure Patient identified, informed consent performed, consent signed.   Patient does understand that irregular bleeding is a very common side effect of this medication. She was advised to have backup contraception for one week after placement. Pregnancy test in clinic today was negative.  Appropriate time out taken.  Patient's right arm was prepped and draped in the usual sterile fashion. The ruler used to measure and mark insertion area.  Patient was prepped with alcohol swab and then injected with 3 ml of 1% lidocaine.  She was prepped with betadine, Nexplanon removed from packaging,  Device confirmed in needle, then inserted full length of needle and withdrawn per handbook instructions. Nexplanon was able to palpated in the patient's arm; patient palpated the insert herself. There was minimal blood loss.  Patient insertion site covered with guaze and a pressure bandage to reduce any bruising.  The patient tolerated the procedure well and was given post procedure instructions.       [name], MD, FACOG Obstetrician & Gynecologist, [facility] for [employer], [facility]

## 2021-08-13 ENCOUNTER — Encounter (HOSPITAL_COMMUNITY): Payer: Self-pay | Admitting: Emergency Medicine

## 2021-08-13 ENCOUNTER — Ambulatory Visit (HOSPITAL_COMMUNITY)
Admission: EM | Admit: 2021-08-13 | Discharge: 2021-08-13 | Disposition: A | Discharge status: Home / Self Care | Payer: BC Managed Care – PPO | Attending: Physician Assistant | Admitting: Physician Assistant

## 2021-08-13 ENCOUNTER — Ambulatory Visit (INDEPENDENT_AMBULATORY_CARE_PROVIDER_SITE_OTHER): Payer: BC Managed Care – PPO

## 2021-08-13 ENCOUNTER — Other Ambulatory Visit: Payer: Self-pay

## 2021-08-13 DIAGNOSIS — M25571 Pain in right ankle and joints of right foot: Secondary | ICD-10-CM

## 2021-08-13 DIAGNOSIS — M25572 Pain in left ankle and joints of left foot: Secondary | ICD-10-CM | POA: Diagnosis not present

## 2021-08-13 MED ORDER — DICLOFENAC SODIUM 75 MG PO TBEC: 75.0000 mg | DELAYED_RELEASE_TABLET | Freq: Two times a day (BID) | ORAL | 0 refills | Status: DC

## 2021-08-13 NOTE — ED Triage Notes (Signed)
Tuesday flew back into town from [place].  Patient reports rushing and potentially tripping while running through airport.  Left ankle is hurting.  Patient reports ibuprofen for pain

## 2021-08-13 NOTE — ED Provider Notes (Addendum)
M[facility] Coch[facility]    CSN: [id number] Arrival date & time: 08/13/21  1653      History   Chief Complaint Chief Complaint  Patient presents with   Ankle Pain    HPI [name] is a 46 y.o. female.   Patient presents today with a 1 day history of intermittent shooting pain in her medial left ankle.  Reports that she denies any known injury but does report that symptoms began after a very stressful situation in the airport where she had to race back-and-forth through the terminal.  She tripped several times during this time and wonders if she could have injured herself at that time.  Pain occurs randomly without identifiable trigger but does typically occur with either standing or ambulation.  She reports pain is shooting and goes into her lower leg lasting for few seconds and then resolves without intervention.  She has tried ibuprofen without improvement.  Denies previous ankle injury or surgery.  Denies any numbness or paresthesias in her foot.  She reports that the situation was incredibly stressful and she had multiple panic attacks.  She has a history of anxiety, panic attacks and is followed by mental health specialist.  Reports that every time she thinks of this episode she becomes very overwhelmed which triggers another panic attack.   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   Gonorrhea 06/03/2021   ASCUS with positive high risk HPV cervical pap on 04/18/2021 04/25/2021   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 smear 03/18/2020 03/18/2020   Hiatal hernia 05/20/2014   Bipolar disorder (HCC) 05/20/2014   GERD (gastroesophageal reflux disease) 05/20/2014   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 05/18/14 05/20/2014   Osteoarthritis of both knees 05/20/2014   Morbid obesity with BMI of 40.0-44.9, adult (HCC) 05/18/2014   Anxiety state 10/31/2006   DEPRESSION 10/31/2006    Past Surgical History:  Procedure Laterality Date   LAPAROSCOPIC GASTRIC SLEEVE RESECTION WITH HIATAL HERNIA REPAIR N/A 05/18/2014   Procedure: LAPAROSCOPIC GASTRIC SLEEVE RESECTION WITH HIATAL HERNIA REPAIR;  Surgeon: Gaynelle Adu, MD;  Location: [facility];  Service: General;  Laterality: N/A;   NO PAST SURGERIES     UPPER GI ENDOSCOPY N/A 05/18/2014   Procedure: UPPER GI ENDOSCOPY;  Surgeon: Gaynelle Adu, MD;  Location: [facility];  Service: General;  Laterality: N/A;    OB History     Gravida  4   Para  2   Term  2   Preterm      AB  1   Living  2      SAB      IAB  1   Ectopic      Multiple      Live Births  2        Obstetric Comments  Unsure of dating           Home Medications    Prior  to Admission medications   Medication Sig Start Date End Date Taking? Authorizing Provider  diclofenac (VOLTAREN) 75 MG EC tablet Take 1 tablet (75 mg total) by mouth 2 (two) times daily. 08/13/21  Yes Keene Gilkey K, PA-C  amphetamine-dextroamphetamine (ADDERALL) 15 MG tablet Take 15 mg by mouth 2 (two) times daily.    [provider]  CAPLYTA 42 MG capsule Take 42 mg by mouth at bedtime. 03/11/21   [provider]  lamoTRIgine (LAMICTAL) 200 MG tablet Take 400 mg by mouth every morning.     [provider]  QUEtiapine (SEROQUEL) 300 MG tablet Take 300 mg by mouth at bedtime.    [provider]    Family History Family History  Problem Relation Age of Onset   Cancer Father        Prostate   Breast cancer Maternal Aunt     Breast cancer Paternal Aunt 2   Breast cancer Paternal Aunt 30   Breast cancer Paternal Grandmother 98    Social History Social History   Tobacco Use   Smoking status: Former    Packs/day: 0.50    Years: 2.00    Pack years: 1.00    Types: Cigarettes    Quit date: 05/12/2010    Years since quitting: 11.2   Smokeless tobacco: Never  Vaping Use   Vaping Use: Never used  Substance Use Topics   Alcohol use: Yes    Alcohol/week: 0.0 standard drinks    Comment:  glass of wine 3-4 x week   Drug use: Not Currently    Types: Marijuana     Allergies   Patient has no known allergies.   Review of Systems Review of Systems  Constitutional:  Positive for activity change. Negative for appetite change, fatigue and fever.  Musculoskeletal:  Positive for arthralgias and gait problem. Negative for joint swelling and myalgias.  Skin:  Negative for color change and wound.  Neurological:  Negative for weakness and numbness.  Psychiatric/Behavioral:  The patient is nervous/anxious.     Physical Exam Triage Vital Signs ED Triage Vitals  Enc Vitals Group     BP 08/13/21 1740 (!) 156/94     Pulse Rate 08/13/21 1740 95     Resp 08/13/21 1740 (!) 22     Temp 08/13/21 1740 98.5 F (36.9 C)     Temp Source 08/13/21 1740 Oral     SpO2 08/13/21 1740 99 %     Weight --      Height --      Head Circumference --      Peak Flow --      Pain Score 08/13/21 1737 8     Pain Loc --      Pain Edu? --      Excl. in GC? --    No data found.  Updated Vital Signs BP (!) 156/94 (BP Location: Left Arm) Comment (BP Location): large cuff  Pulse 95   Temp 98.5 F (36.9 C) (Oral)   Resp (!) 22   LMP 07/19/2021 Comment: miscarried on Jul 19, 2021  SpO2 99%   Visual Acuity Right Eye Distance:   Left Eye Distance:   Bilateral Distance:    Right Eye Near:   Left Eye Near:    Bilateral Near:     Physical Exam Vitals reviewed.  Constitutional:      General: She is awake. She is not in acute  distress.    Appearance: Normal appearance. She is well-developed.  She is not ill-appearing.     Comments: Very pleasant female appears stated age in no acute distress sitting comfortably in exam room  HENT:     Head: Normocephalic and atraumatic.  Cardiovascular:     Rate and Rhythm: Normal rate and regular rhythm.     Pulses:          Posterior tibial pulses are 2+ on the right side and 2+ on the left side.     Heart sounds: Normal heart sounds, S1 normal and S2 normal. No murmur heard.    Comments: Unable to assess capillary refill due to nail polish Pulmonary:     Effort: Pulmonary effort is normal.     Breath sounds: Normal breath sounds. No wheezing, rhonchi or rales.     Comments: Clear to auscultation bilaterally Musculoskeletal:     Right lower leg: No edema.     Left lower leg: No edema.     Left ankle: No swelling. Tenderness present over the medial malleolus. Decreased range of motion.  Psychiatric:        Attention and Perception: Attention normal.        Mood and Affect: Mood is anxious. Affect is tearful.        Behavior: Behavior is cooperative.     UC Treatments / Results  Labs (all labs ordered are listed, but only abnormal results are displayed) Labs Reviewed - No data to display  EKG   Radiology DG Ankle Complete Left  Result Date: 08/13/2021 CLINICAL DATA:  Acute LEFT ankle pain following injury. Initial encounter. EXAM: LEFT ANKLE COMPLETE - 3+ VIEW COMPARISON:  None Available. FINDINGS: There is no evidence of fracture, dislocation, or joint effusion. There is no evidence of arthropathy or other focal bone abnormality. Soft tissues are unremarkable. IMPRESSION: Negative. Electronically Signed   By: Harmon Pier M.D.   On: 08/13/2021 18:24    Procedures Procedures (including critical care time)  Medications Ordered in UC Medications - No data to display  Initial Impression / Assessment and Plan / UC Course  I have reviewed the triage vital signs and  the nursing notes.  Pertinent labs & imaging results that were available during my care of the patient were reviewed by me and considered in my medical decision making (see chart for details).     X-ray was obtained which showed no acute osseous abnormality.  Suspect sprain as etiology of symptoms.  Recommended conservative treatment measures including Handler protocol for symptom relief.  She was placed in a brace for comfort and support.  She was prescribed diclofenac to be used up to twice a day as needed with instruction not to take additional NSAIDs with this medication due to risk of GI bleeding.  She can use Tylenol for breakthrough pain.  If symptoms or not improving quickly she is to follow-up with orthopedics and was given contact information for local provider.  Discussed that if she has any worsening symptoms she is to return for reevaluation to which she expressed understanding.   Final Clinical Impressions(s) / UC Diagnoses   Final diagnoses:  Acute left ankle pain     Discharge Instructions      Your x-rays look good.  I am concerned that you injured a ligament.  Please use the Reust protocol (rest, ice, compression/brace, elevation).  Wear your brace to provide support.  Take diclofenac for pain.  You should not take additional NSAIDs with this medication including aspirin, ibuprofen/Advil, naproxen/Aleve.  You can use Tylenol  for breakthrough pain.  If your symptoms or not improving quickly please follow-up with sports medicine; call to schedule an appointment with them.     ED Prescriptions     Medication Sig Dispense Auth. Provider   diclofenac (VOLTAREN) 75 MG EC tablet Take 1 tablet (75 mg total) by mouth 2 (two) times daily. 14 tablet Shatonya Passon, Noberto RetortErin K, PA-C      PDMP not reviewed this encounter.   Jeani HawkingRaspet, Vaughn Beaumier K, PA-C 08/13/21 1851    Kalon Erhardt, Noberto RetortErin K, PA-C 08/14/21 1341

## 2021-08-13 NOTE — Discharge Instructions (Signed)
Your x-rays look good.  I am concerned that you injured a ligament.  Please use the [place] protocol (rest, ice, compression/brace, elevation).  Wear your brace to provide support.  Take diclofenac for pain.  You should not take additional NSAIDs with this medication including aspirin, ibuprofen/Advil, naproxen/Aleve.  You can use Tylenol for breakthrough pain.  If your symptoms or not improving quickly please follow-up with sports medicine; call to schedule an appointment with them.

## 2021-08-14 ENCOUNTER — Telehealth (HOSPITAL_COMMUNITY): Payer: Self-pay | Admitting: Physician Assistant

## 2021-08-14 NOTE — Telephone Encounter (Signed)
Pt called stating her diagnosis code was wrong on her AVS. She states it should be left ankle pain but it says right. She needs dx code updated for court documents.

## 2021-08-14 NOTE — Telephone Encounter (Signed)
Diagnosis code updated in epic to specify left ankle.

## 2021-08-30 ENCOUNTER — Encounter: Payer: BC Managed Care – PPO | Admitting: Obstetrics & Gynecology

## 2021-09-26 ENCOUNTER — Ambulatory Visit (INDEPENDENT_AMBULATORY_CARE_PROVIDER_SITE_OTHER): Payer: BC Managed Care – PPO

## 2021-09-26 VITALS — BP 121/84 | HR 66 | Wt 201.0 lb

## 2021-09-26 DIAGNOSIS — Z23 Encounter for immunization: Secondary | ICD-10-CM | POA: Diagnosis not present

## 2021-09-26 NOTE — Progress Notes (Signed)
Meredith Cochran is here for their 3rd Gardasil injection. Pt denies any issues at this time. Pt tolerated injection well.

## 2021-11-22 ENCOUNTER — Other Ambulatory Visit (HOSPITAL_COMMUNITY)
Admission: RE | Admit: 2021-11-22 | Discharge: 2021-11-22 | Disposition: A | Discharge status: Home / Self Care | Payer: BC Managed Care – PPO | Source: Ambulatory Visit | Attending: Obstetrics & Gynecology | Admitting: Obstetrics & Gynecology

## 2021-11-22 ENCOUNTER — Ambulatory Visit (INDEPENDENT_AMBULATORY_CARE_PROVIDER_SITE_OTHER): Payer: BC Managed Care – PPO | Admitting: Obstetrics & Gynecology

## 2021-11-22 ENCOUNTER — Encounter: Payer: Self-pay | Admitting: Obstetrics & Gynecology

## 2021-11-22 VITALS — BP 163/89 | Wt 198.0 lb

## 2021-11-22 DIAGNOSIS — N871 Moderate cervical dysplasia: Secondary | ICD-10-CM | POA: Diagnosis present

## 2021-11-22 HISTORY — PX: LEEP: SHX91

## 2021-11-22 NOTE — Patient Instructions (Signed)
LEEP POST-PROCEDURE INSTRUCTIONS  You may take Ibuprofen, Aleve or Tylenol for pain if needed.  Cramping is normal.  You will have black and/or bloody discharge at first.  This will lighten and then turn clear before completely resolving.  This will take 2 to 3 weeks.  Put nothing in your vagina until the bleeding or discharge stops (usually 3 weeks).  You need to call if you have redness around the biopsy site, if there is any unusual draining, if the bleeding is heavy, or if you are concerned.  Shower or bathe as normal  We will call you within one week with results or we will discuss the results at your follow-up appointment if needed.  You will need to return for a follow-up Pap smear as directed by your physician. 

## 2021-11-22 NOTE — Progress Notes (Signed)
   GYNECOLOGY OFFICE PROCEDURE NOTE  Meredith Cochran is a 46 y.o. [id number] here for LEEP. No GYN concerns. Pap smear and colposcopy history reviewed.    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  Role of HPV,cervical dysplasia and need for close followup was empasized. Informed written consent was obtained. All questions were answered. Time out performed. Urine pregnancy test was negative.  ??Procedure: The patient was placed in lithotomy position and the bivalved coated speculum was placed in the patient's vagina. A grounding pad placed on the patient. Lugol's solution was applied to the cervix and areas of decreased uptake were noted around the transformation zone.   Local anesthesia was administered via an intracervical block using 10 ml of 2% Lidocaine with epinephrine. The suction was turned on and the Large Fisher Cone Biopsy Excisor on 60 Watts of blended current was used to excise the area of decreased uptake and excise the entire transformation zone. Excellent hemostasis was achieved using roller ball coagulation set at 60 Watts coagulation current. Monsel's solution was then applied and the speculum was removed from the vagina. Specimens were sent to pathology.  ?The patient tolerated the procedure well. Post-operative instructions given to patient, including instruction to seek medical attention for persistent bright red bleeding, fever, abdominal/pelvic pain, dysuria, nausea or vomiting. She was also told about the possibility of having copious yellow to black tinged discharge for weeks. She was counseled to avoid anything in the vagina (sex/douching/tampons) for 3 weeks. She  has a 4 week post-operative check to assess wound healing, review results and discuss further management.    [name], MD, FACOG Obstetrician & Gynecologist, [facility] for [employer], [facility]

## 2021-11-24 ENCOUNTER — Encounter: Payer: Self-pay | Admitting: Obstetrics & Gynecology

## 2021-11-24 LAB — SURGICAL PATHOLOGY

## 2021-12-27 ENCOUNTER — Ambulatory Visit (INDEPENDENT_AMBULATORY_CARE_PROVIDER_SITE_OTHER): Payer: BC Managed Care – PPO | Admitting: Obstetrics & Gynecology

## 2021-12-27 ENCOUNTER — Encounter: Payer: Self-pay | Admitting: Obstetrics & Gynecology

## 2021-12-27 VITALS — BP 160/89 | HR 101 | Wt 201.0 lb

## 2021-12-27 DIAGNOSIS — Z9889 Other specified postprocedural states: Secondary | ICD-10-CM

## 2021-12-27 NOTE — Progress Notes (Signed)
   GYNECOLOGY OFFICE VISIT NOTE  History:   Meredith Cochran is a 46 y.o. [phone] here today for follow up after LEEP on 11/22/2021. She denies any abnormal vaginal discharge, bleeding, pelvic pain or other concerns.    Past Medical History:  Diagnosis Date   Asthma    triggered by cold   Bipolar disorder ([place])    Depression    GERD (gastroesophageal reflux disease)    Gonorrhea 06/03/2021   Positive in vaginal and pharyngeal swabs>treated 05/31/21   History of hiatal hernia    Tremor    states left hand on occasion"from some of the psychotic meds in the past"   Past Surgical History:  Procedure Laterality Date   LAPAROSCOPIC GASTRIC SLEEVE RESECTION WITH HIATAL HERNIA REPAIR N/A 05/18/2014   Procedure: LAPAROSCOPIC GASTRIC SLEEVE RESECTION WITH HIATAL HERNIA REPAIR;  Surgeon: Greer Pickerel, MD;  Location: [facility];  Service: General;  Laterality: N/A;   NO PAST SURGERIES     UPPER GI ENDOSCOPY N/A 05/18/2014   Procedure: UPPER GI ENDOSCOPY;  Surgeon: Greer Pickerel, MD;  Location: [facility];  Service: General;  Laterality: N/A;   The following portions of the patient's history were reviewed and updated as appropriate: allergies, current medications, past family history, past medical history, past social history, past surgical history and problem list.   Review of Systems:  Pertinent items noted in HPI and remainder of comprehensive ROS otherwise negative.  Physical Exam:  BP (!) 160/89   Pulse (!) 101   Wt 201 lb (91.2 kg)   BMI 30.56 kg/m  CONSTITUTIONAL: Well-developed, well-nourished female in no acute distress.  MUSCULOSKELETAL: Normal range of motion. No edema noted. NEUROLOGIC: Alert and oriented to person, place, and time. Normal muscle tone coordination. No cranial nerve deficit noted. PSYCHIATRIC: Normal mood and affect. Normal behavior. Normal judgment and thought content. CARDIOVASCULAR: Normal heart rate noted RESPIRATORY: Effort and breath sounds normal, no problems with  respiration noted ABDOMEN: No masses noted. No other overt distention noted.   PELVIC: Normal appearing external genitalia; normal urethral meatus; normal appearing vaginal mucosa and well-healing cervix.  No abnormal discharge noted.  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 S/P LEEP of cervix Cervix is healing well, pelvic rest restrictions lifted. 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 Will repeat pap and HPV testing in March 2024, discussed with patient. Routine preventative health maintenance measures emphasized. Please refer to After Visit Summary for other counseling recommendations.   Return in about 5 months (around 05/28/2022) for Repeat pap smear.    I spent 20 minutes dedicated to the care of this patient including pre-visit review of records, face to face time with the patient discussing her conditions and treatments and post visit orders.    [name], MD, [place] for [employer], [place]

## 2022-05-29 ENCOUNTER — Encounter: Payer: Self-pay | Admitting: Obstetrics & Gynecology

## 2022-05-29 ENCOUNTER — Other Ambulatory Visit (HOSPITAL_COMMUNITY)
Admission: RE | Admit: 2022-05-29 | Discharge: 2022-05-29 | Disposition: A | Discharge status: Home / Self Care | Payer: BC Managed Care – PPO | Source: Ambulatory Visit | Attending: Obstetrics & Gynecology | Admitting: Obstetrics & Gynecology

## 2022-05-29 ENCOUNTER — Ambulatory Visit (INDEPENDENT_AMBULATORY_CARE_PROVIDER_SITE_OTHER): Payer: BC Managed Care – PPO | Admitting: Obstetrics & Gynecology

## 2022-05-29 VITALS — BP 144/84 | HR 85 | Wt 222.0 lb

## 2022-05-29 DIAGNOSIS — Z09 Encounter for follow-up examination after completed treatment for conditions other than malignant neoplasm: Secondary | ICD-10-CM | POA: Insufficient documentation

## 2022-05-29 DIAGNOSIS — Z9889 Other specified postprocedural states: Secondary | ICD-10-CM | POA: Diagnosis not present

## 2022-05-29 DIAGNOSIS — Z124 Encounter for screening for malignant neoplasm of cervix: Secondary | ICD-10-CM | POA: Diagnosis not present

## 2022-05-29 DIAGNOSIS — Z1231 Encounter for screening mammogram for malignant neoplasm of breast: Secondary | ICD-10-CM | POA: Diagnosis not present

## 2022-05-29 DIAGNOSIS — Z01419 Encounter for gynecological examination (general) (routine) without abnormal findings: Secondary | ICD-10-CM | POA: Diagnosis not present

## 2022-05-29 NOTE — Progress Notes (Addendum)
   GYNECOLOGY ANNUAL OFFICE VISIT NOTE  History:   Meredith Cochran is a 47 y.o. [phone] here today for follow up pap after LEEP; she underwent LEEP on 11/22/2021 and pathology showed CIN 2 with negative margins. She denies any abnormal vaginal discharge, bleeding, pelvic pain or other concerns. This will count as her annual pap smear.    Past Medical History:  Diagnosis Date   Asthma    triggered by cold   Bipolar disorder ([facility])    Depression    GERD (gastroesophageal reflux disease)    Gonorrhea 06/03/2021   Positive in vaginal and pharyngeal swabs>treated 05/31/21   History of hiatal hernia    Tremor    states left hand on occasion"from some of the psychotic meds in the past"    Past Surgical History:  Procedure Laterality Date   LAPAROSCOPIC GASTRIC SLEEVE RESECTION WITH HIATAL HERNIA REPAIR N/A 05/18/2014   Procedure: LAPAROSCOPIC GASTRIC SLEEVE RESECTION WITH HIATAL HERNIA REPAIR;  Surgeon: Gaynelle Adu, MD;  Location: [facility];  Service: General;  Laterality: N/A;   NO PAST SURGERIES     UPPER GI ENDOSCOPY N/A 05/18/2014   Procedure: UPPER GI ENDOSCOPY;  Surgeon: Gaynelle Adu, MD;  Location: [facility];  Service: General;  Laterality: N/A;    The following portions of the patient's history were reviewed and updated as appropriate: allergies, current medications, past family history, past medical history, past social history, past surgical history and problem list.   Review of Systems:  Pertinent items noted in HPI and remainder of comprehensive ROS otherwise negative.  Physical Exam:  BP (!) 144/84   Pulse 85   Wt 222 lb (100.7 kg)   BMI 33.75 kg/m  CONSTITUTIONAL: Well-developed, well-nourished female in no acute distress.  HEENT:  Normocephalic, atraumatic. External right and left ear normal. No scleral icterus.  NECK: Normal range of motion, supple, no masses noted on observation SKIN: No rash noted. Not diaphoretic. No erythema. No pallor. MUSCULOSKELETAL: Normal range of  motion. No edema noted. NEUROLOGIC: Alert and oriented to person, place, and time. Normal muscle tone coordination. No cranial nerve deficit noted. PSYCHIATRIC: Normal mood and affect. Normal behavior. Normal judgment and thought content. CARDIOVASCULAR: Normal heart rate noted RESPIRATORY: Effort and breath sounds normal, no problems with respiration noted ABDOMEN: No masses noted. No other overt distention noted.   PELVIC: Normal appearing external genitalia; normal urethral meatus; normal appearing vaginal mucosa and cervix. Well healed LEEP site, some cervical stenosis noted, unable to get endocervical cells.  No abnormal discharge noted. Pap done.   Performed in the presence of a chaperone    Assessment and Plan:     1. Breast cancer screening by mammogram Mammogram scheduled. - MM 3D SCREENING MAMMOGRAM BILATERAL BREAST; Future  2. S/P LEEP of cervix for high grade dysplasia 3. Pap smear for cervical cancer screening  4. Well woman exam with routine gynecological exam  - Cytology - PAP Routine preventative health maintenance measures emphasized. Please refer to After Visit Summary for other counseling recommendations.   Return for follow up as recommended or any gynecologic concerns.    I spent 20 minutes dedicated to the care of this patient including pre-visit review of records, face to face time with the patient discussing her conditions and treatments and post visit orders.    [name], MD, FACOG Obstetrician & Gynecologist, [facility] for [employer], [facility]

## 2022-06-02 LAB — CYTOLOGY - PAP
Adequacy: ABSENT
Comment: NEGATIVE
Diagnosis: NEGATIVE
High risk HPV: NEGATIVE

## 2022-06-14 ENCOUNTER — Other Ambulatory Visit: Payer: Self-pay | Admitting: Obstetrics & Gynecology

## 2022-06-14 DIAGNOSIS — B3731 Acute candidiasis of vulva and vagina: Secondary | ICD-10-CM

## 2022-07-06 NOTE — Addendum Note (Signed)
Addended by: [name] A on: 07/06/2022 09:53 AM   Modules accepted: Level of Service

## 2022-07-13 ENCOUNTER — Ambulatory Visit
Admission: RE | Admit: 2022-07-13 | Discharge: 2022-07-13 | Disposition: A | Discharge status: Home / Self Care | Payer: BC Managed Care – PPO | Source: Ambulatory Visit | Attending: Obstetrics & Gynecology | Admitting: Obstetrics & Gynecology

## 2022-07-13 DIAGNOSIS — Z1231 Encounter for screening mammogram for malignant neoplasm of breast: Secondary | ICD-10-CM

## 2022-07-17 ENCOUNTER — Other Ambulatory Visit: Payer: Self-pay | Admitting: Obstetrics & Gynecology

## 2022-07-17 DIAGNOSIS — R928 Other abnormal and inconclusive findings on diagnostic imaging of breast: Secondary | ICD-10-CM

## 2023-01-05 IMAGING — US US OB < 14 WEEKS - US OB TV
1 series · 15 of 28 positions shown · non-contrast
Comparison: None

CLINICAL DATA: Abdominal pain, bleeding for 4 days, first trimester
pregnancy, LMP 06/10/2021, quantitative beta hCG pending

EXAM:
OBSTETRIC <14 WK US AND TRANSVAGINAL OB US
TECHNIQUE: Both transabdominal and transvaginal ultrasound examinations were
performed for complete evaluation of the gestation as well as the
maternal uterus, adnexal regions, and pelvic cul-de-sac.
Transvaginal technique was performed to assess early pregnancy.

[Series 1: us ob < 14 weeks - us ob tv · 15 of 101 slices shown]
[im 1/101]
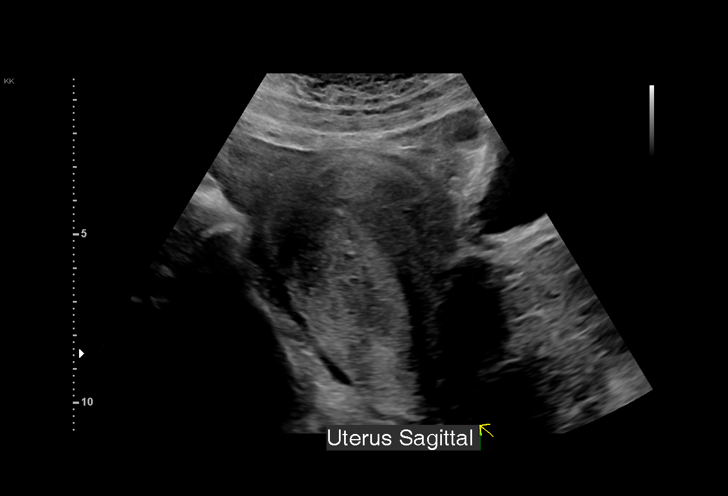
[im 8/101]
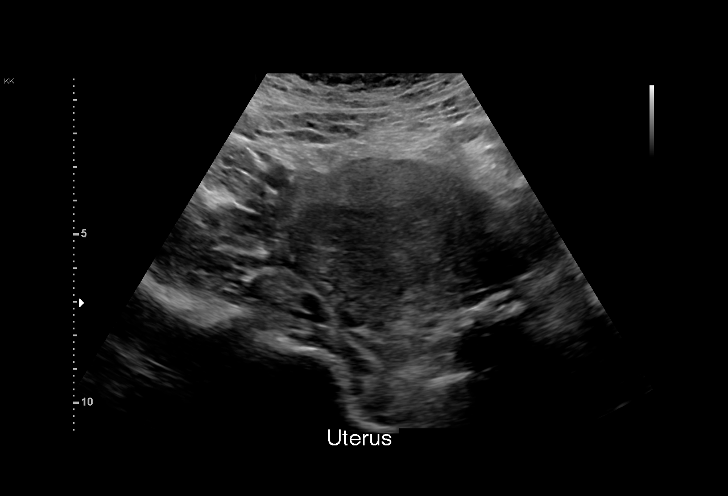
[im 15/101]
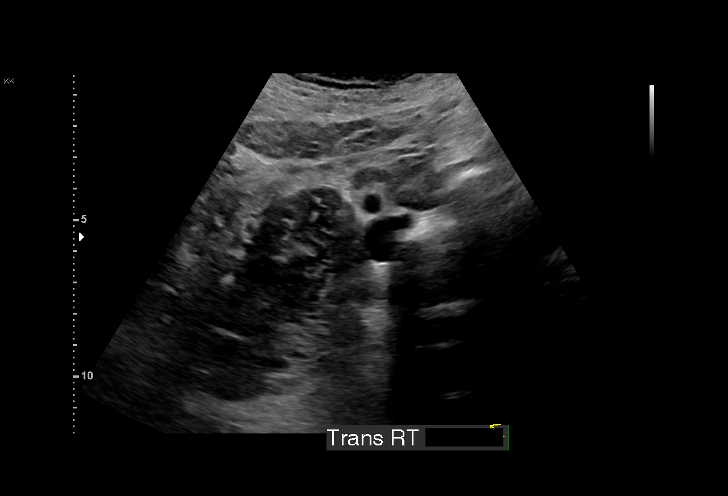
[im 23/101]
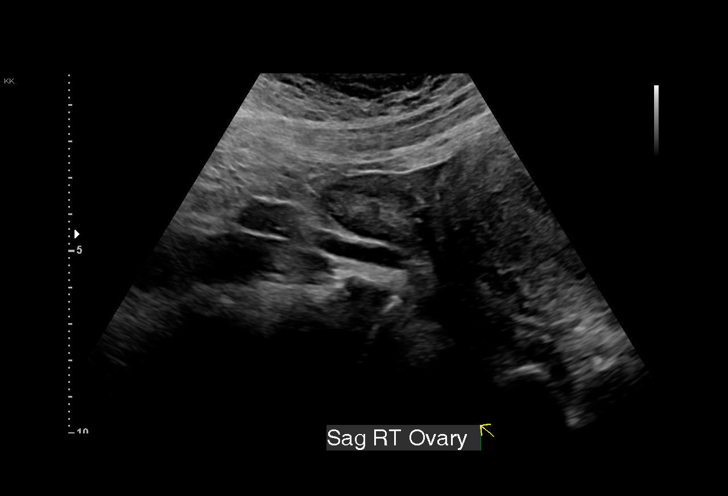
[im 30/101]
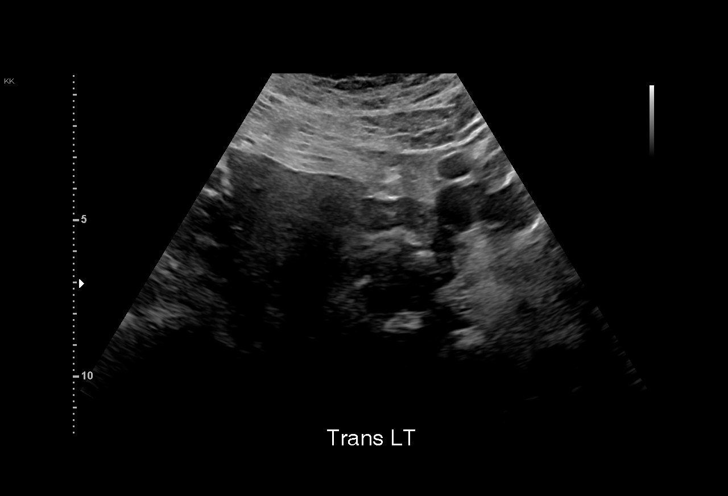
[im 38/101]
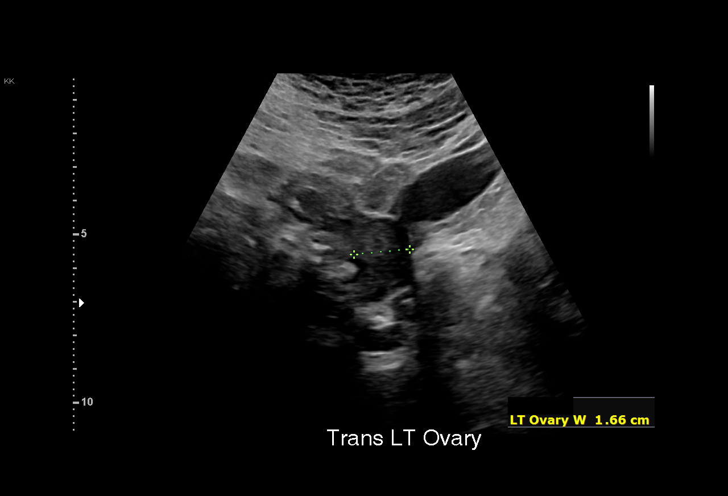
[im 45/101]
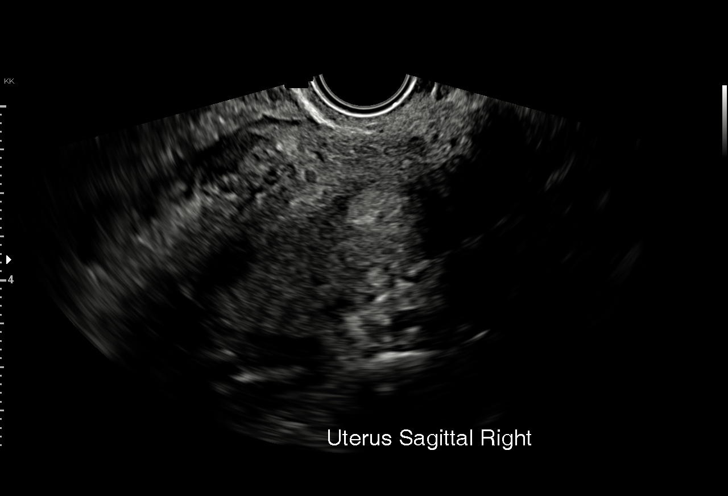
[im 52/101]
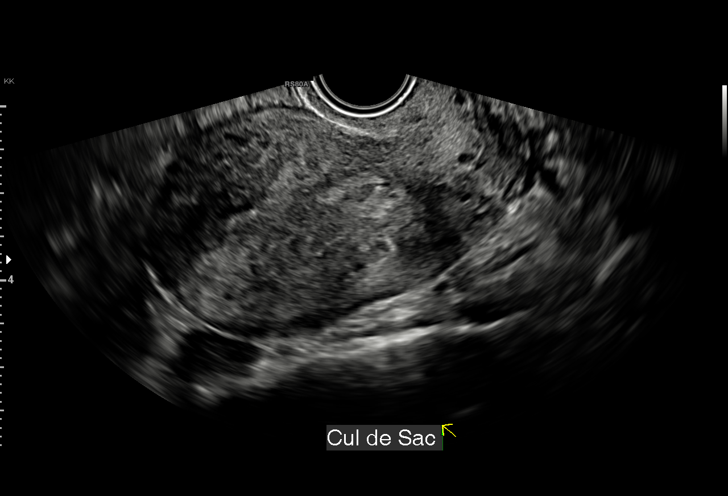
[im 56/101]
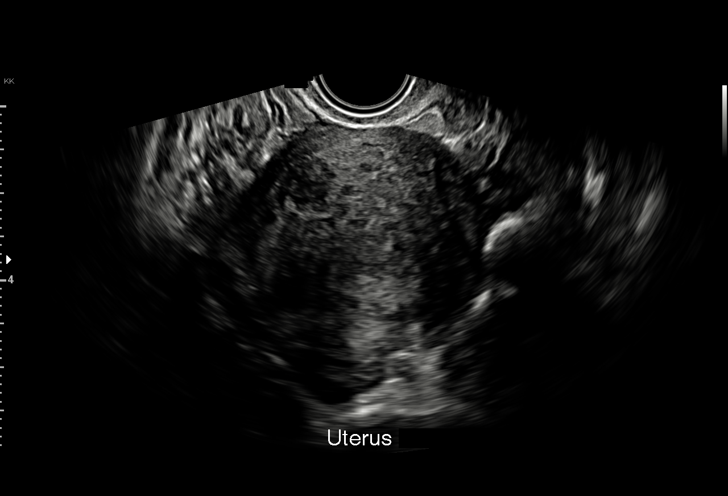
[im 63/101]
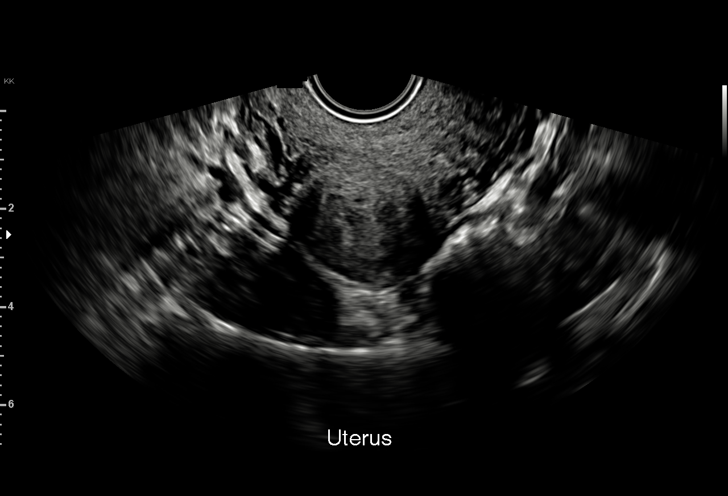
[im 71/101]
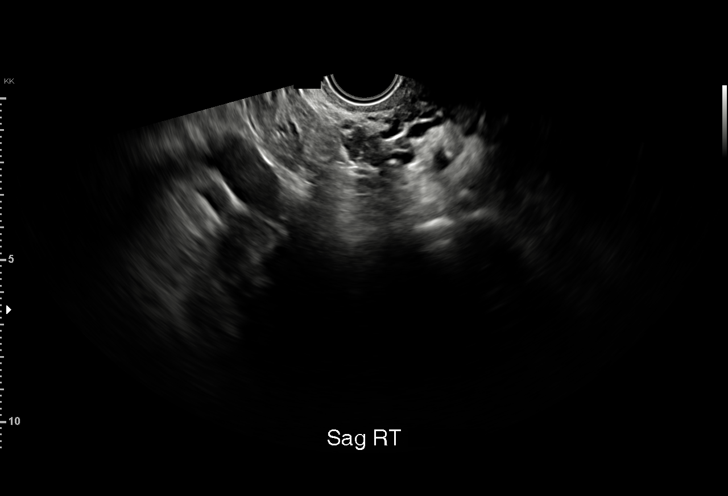
[im 78/101]
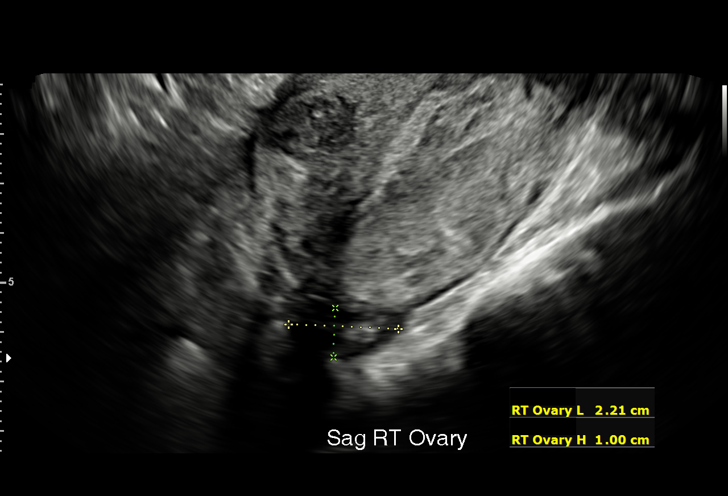
[im 86/101]
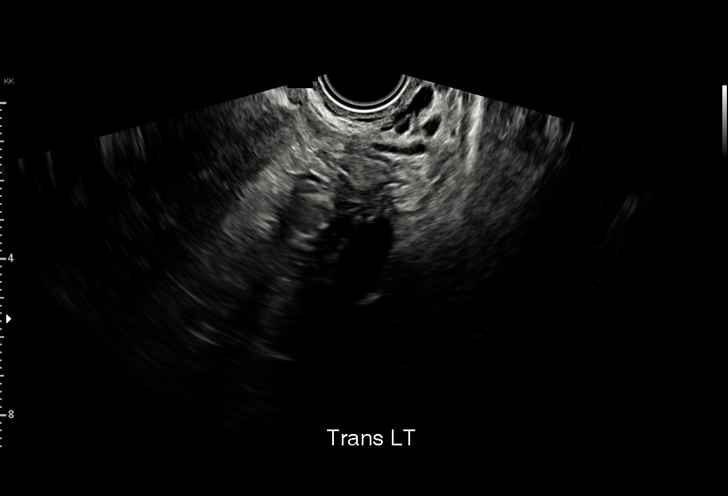
[im 93/101]
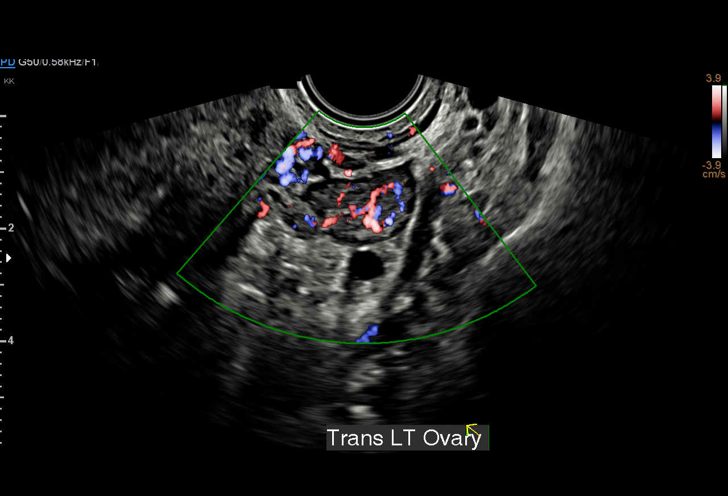
[im 101/101]
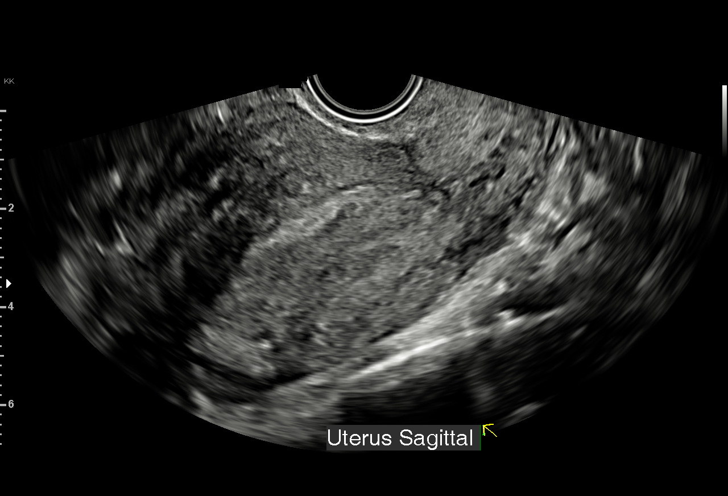

[15 of 28 positions shown; findings below may reference images not displayed]

FINDINGS: Intrauterine gestational sac: Absent

Yolk sac:  N/A

Embryo:  N/A

Cardiac Activity: N/A

Heart Rate: N/A  bpm

MSD:   mm    w     d

CRL:    mm    w    d                  US EDC:

Subchorionic hemorrhage:  N/A

Maternal uterus/adnexae:

Uterus anteverted with small intramural leiomyoma at anterior upper
uterus 17 x 14 x 15 mm.

Endometrial complex normal appearance without mass, fluid, or
gestational sac.

RIGHT ovary normal size and morphology, 2.2 x 1.0 x 1.6 cm.

LEFT ovary normal size and morphology, 1.8 x 3.0 x 1.5 cm.

Trace free pelvic fluid.

No adnexal masses.
IMPRESSION: No intrauterine gestation identified.

Findings are consistent with pregnancy of unknown location.

Differential diagnosis includes early intrauterine pregnancy too
early to visualize, spontaneous abortion, and ectopic pregnancy.

Serial quantitative beta HCG and or follow-up ultrasound recommended
to definitively exclude ectopic pregnancy.

## 2023-01-12 ENCOUNTER — Telehealth: Payer: Self-pay | Admitting: *Deleted

## 2023-01-12 NOTE — Telephone Encounter (Signed)
No entry 

## 2023-12-06 ENCOUNTER — Encounter: Payer: Self-pay | Admitting: Obstetrics & Gynecology

## 2023-12-07 ENCOUNTER — Other Ambulatory Visit: Payer: Self-pay | Admitting: *Deleted

## 2023-12-07 DIAGNOSIS — R928 Other abnormal and inconclusive findings on diagnostic imaging of breast: Secondary | ICD-10-CM

## 2023-12-14 ENCOUNTER — Encounter

## 2023-12-14 DIAGNOSIS — Z1231 Encounter for screening mammogram for malignant neoplasm of breast: Secondary | ICD-10-CM

## 2023-12-17 ENCOUNTER — Other Ambulatory Visit

## 2023-12-17 ENCOUNTER — Encounter

## 2023-12-31 ENCOUNTER — Other Ambulatory Visit: Payer: Self-pay | Admitting: Obstetrics & Gynecology

## 2023-12-31 ENCOUNTER — Ambulatory Visit
Admission: RE | Admit: 2023-12-31 | Discharge: 2023-12-31 | Disposition: A | Discharge status: Home / Self Care | Source: Ambulatory Visit | Attending: Obstetrics & Gynecology | Admitting: Obstetrics & Gynecology

## 2023-12-31 ENCOUNTER — Ambulatory Visit: Payer: Self-pay | Admitting: Obstetrics & Gynecology

## 2023-12-31 DIAGNOSIS — N6489 Other specified disorders of breast: Secondary | ICD-10-CM

## 2023-12-31 DIAGNOSIS — R928 Other abnormal and inconclusive findings on diagnostic imaging of breast: Secondary | ICD-10-CM

## 2024-01-01 ENCOUNTER — Encounter: Payer: Self-pay | Admitting: Obstetrics & Gynecology

## 2024-01-02 ENCOUNTER — Other Ambulatory Visit (HOSPITAL_COMMUNITY)
Admission: RE | Admit: 2024-01-02 | Discharge: 2024-01-02 | Disposition: A | Discharge status: Home / Self Care | Source: Ambulatory Visit | Attending: Obstetrics & Gynecology | Admitting: Obstetrics & Gynecology

## 2024-01-02 ENCOUNTER — Ambulatory Visit (INDEPENDENT_AMBULATORY_CARE_PROVIDER_SITE_OTHER): Admitting: Obstetrics & Gynecology

## 2024-01-02 ENCOUNTER — Encounter: Payer: Self-pay | Admitting: Obstetrics & Gynecology

## 2024-01-02 VITALS — BP 144/88 | HR 88 | Ht 68.0 in | Wt 232.0 lb

## 2024-01-02 DIAGNOSIS — Z113 Encounter for screening for infections with a predominantly sexual mode of transmission: Secondary | ICD-10-CM | POA: Diagnosis present

## 2024-01-02 DIAGNOSIS — Z01419 Encounter for gynecological examination (general) (routine) without abnormal findings: Secondary | ICD-10-CM

## 2024-01-02 DIAGNOSIS — Z1211 Encounter for screening for malignant neoplasm of colon: Secondary | ICD-10-CM

## 2024-01-02 NOTE — Progress Notes (Signed)
 GYNECOLOGY ANNUAL PREVENTATIVE CARE ENCOUNTER NOTE  History:    Meredith Cochran is a 48 y.o. [phone] female here for a routine annual gynecologic exam.  Current complaints: none.   Denies abnormal vaginal bleeding, discharge, pelvic pain, problems with intercourse or other gynecologic concerns.  Gynecologic History Patient's last menstrual period was 12/30/2023 (exact date). Contraception: Nexplanon  Last Pap: 05/29/2022. Result was normal with negative HPV Last Mammogram: 12/31/23.  Result was normal Last Colonoscopy: Not done yet.  Obstetric History OB History  Gravida Para Term Preterm AB Living  4 2 2  1 2   SAB IAB Ectopic Multiple Live Births   1   2    # Outcome Date GA Lbr Len/2nd Weight Sex Type Anes PTL Lv  4 Gravida           3 IAB 2009          2 Term 05/05/05    M Vag-Spont None  LIV  1 Term 08/16/00    F Vag-Spont None  LIV    Obstetric Comments  Unsure of dating     Past Medical History:  Diagnosis Date   Asthma    triggered by cold   Bipolar disorder ([facility])    Depression    GERD (gastroesophageal reflux disease)    Gonorrhea 06/03/2021   Positive in vaginal and pharyngeal swabs>treated 05/31/21   History of hiatal hernia    Tremor    states left hand on occasionfrom some of the psychotic meds in the past    Past Surgical History:  Procedure Laterality Date   LAPAROSCOPIC GASTRIC SLEEVE RESECTION WITH HIATAL HERNIA REPAIR N/A 05/18/2014   Procedure: LAPAROSCOPIC GASTRIC SLEEVE RESECTION WITH HIATAL HERNIA REPAIR;  Surgeon: Camellia Blush, MD;  Location: [facility];  Service: General;  Laterality: N/A;   LEEP  11/22/2021   Pathology showed CIN 2 with negative margins   UPPER GI ENDOSCOPY N/A 05/18/2014   Procedure: UPPER GI ENDOSCOPY;  Surgeon: Camellia Blush, MD;  Location: [facility];  Service: General;  Laterality: N/A;    Current Outpatient Medications on File Prior to Visit  Medication Sig Dispense Refill   amphetamine-dextroamphetamine (ADDERALL) 15 MG  tablet Take 15 mg by mouth 2 (two) times daily.     B Complex-Biotin-FA (MULTI-B COMPLEX) CAPS      Diclofenac  Sodium CR 100 MG 24 hr tablet Take 100 mg by mouth daily as needed.     GEMTESA 75 MG TABS Take 1 tablet by mouth daily.     lamoTRIgine  (LAMICTAL ) 200 MG tablet Take 400 mg by mouth every morning.      losartan (COZAAR) 25 MG tablet Take 25 mg by mouth daily.     QUEtiapine (SEROQUEL XR) 300 MG 24 hr tablet Take 300 mg by mouth at bedtime.     WEGOVY 2.4 MG/0.75ML SOAJ SQ injection SMARTSIG:2.4 Milligram(s) SUB-Q Once a Week     CAPLYTA 42 MG capsule Take 42 mg by mouth at bedtime. (Patient not taking: Reported on 01/02/2024)     fluconazole  (DIFLUCAN ) 150 MG tablet TAKE 1 TABLET (150 MG TOTAL) BY MOUTH ONCE FOR 1 DOSE. MAY REPEAT 3 DAYS LATER IF SYMPTOMS PERSIST (Patient not taking: Reported on 01/02/2024) 1 tablet 3   No current facility-administered medications on file prior to visit.    No Known Allergies  Social History:  reports that she quit smoking about 13 years ago. Her smoking use included cigarettes. She started smoking about 15 years ago. She has a  1 pack-year smoking history. She has never used smokeless tobacco. She reports current alcohol use. She reports that she does not currently use drugs after having used the following drugs: Marijuana.  Family History  Problem Relation Age of Onset   Cancer Father        Prostate   Breast cancer Maternal Aunt    Breast cancer Paternal Aunt 7   Breast cancer Paternal Aunt 73   Breast cancer Paternal Grandmother 34    The following portions of the patient's history were reviewed and updated as appropriate: allergies, current medications, past family history, past medical history, past social history, past surgical history and problem list.  Review of Systems Pertinent items noted in HPI and remainder of comprehensive ROS otherwise negative.  Physical Exam:  BP (!) 144/88   Pulse 88   Ht 5' 8 (1.727 m)   Wt 232 lb  (105.2 kg)   LMP 12/30/2023 (Exact Date)   Breastfeeding No   BMI 35.28 kg/m  CONSTITUTIONAL: Well-developed, well-nourished female in no acute distress.  HENT:  Normocephalic, atraumatic, External right and left ear normal.  EYES: Conjunctivae and EOM are normal. Pupils are equal, round, and reactive to light. No scleral icterus.  NECK: Normal range of motion, supple, no masses observed. SKIN: Skin is warm and dry. No rash noted. Not diaphoretic. No erythema. No pallor. MUSCULOSKELETAL: Normal range of motion. No tenderness.  No cyanosis, clubbing, or edema. NEUROLOGIC: Alert and oriented to person, place, and time. Normal muscle tone coordination.  PSYCHIATRIC: Normal mood and affect. Normal behavior. Normal judgment and thought content. CARDIOVASCULAR: Normal heart rate noted, regular rhythm RESPIRATORY: Clear to auscultation bilaterally. Effort and breath sounds normal, no problems with respiration noted. BREASTS: Symmetric in size. No masses, tenderness, skin changes, nipple drainage, or lymphadenopathy bilaterally. Performed in the presence of a chaperone. ABDOMEN: Soft, no distention noted.  No tenderness, rebound or guarding.  PELVIC: Normal appearing external genitalia and urethral meatus; normal appearing vaginal mucosa and cervix.  Bloody vaginal discharge noted, slow active bleeding noted.  Pap smear obtained, unable to pass endocervical brush into os as this was pinpoint s/p LEEP.  Normal uterine size, no other palpable masses, no uterine or adnexal tenderness.  Performed in the presence of a chaperone.  Assessment and Plan:     1. Screening for colon cancer Discussed need for colon cancer screening over the age 109. Colonoscopy recommended as this is the gold standard. Referral made to Gastroenterology for colonoscopy - Ambulatory referral to Gastroenterology  2. Routine screening for STI (sexually transmitted infection) 3. Well woman exam with routine gynecological exam  (Primary) - Cytology - PAP Will follow up results of pap smear with its requested STI ancillary testing and manage accordingly. Normal breast examination today, she was advised to perform periodic self breast examinations.  Mammogram  is up to date. Please refer to After Visit Summary for other counseling recommendations.      GLORIS HUGGER, MD, FACOG Obstetrician & Gynecologist, [facility] for [employer], [facility]

## 2024-01-08 LAB — CYTOLOGY - PAP
Chlamydia: NEGATIVE
Comment: NEGATIVE
Comment: NEGATIVE
Comment: NEGATIVE
Comment: NEGATIVE
Comment: NEGATIVE
Comment: NORMAL
Diagnosis: NEGATIVE
HPV 16: NEGATIVE
HPV 18 / 45: NEGATIVE
High risk HPV: POSITIVE — AB
Neisseria Gonorrhea: NEGATIVE
Trichomonas: NEGATIVE

## 2024-01-09 ENCOUNTER — Ambulatory Visit: Payer: Self-pay | Admitting: Obstetrics & Gynecology

## 2024-01-09 DIAGNOSIS — R87810 Cervical high risk human papillomavirus (HPV) DNA test positive: Secondary | ICD-10-CM

## 2024-01-30 ENCOUNTER — Encounter

## 2024-03-26 ENCOUNTER — Ambulatory Visit

## 2024-03-26 DIAGNOSIS — K5730 Diverticulosis of large intestine without perforation or abscess without bleeding: Secondary | ICD-10-CM | POA: Diagnosis not present

## 2024-03-26 DIAGNOSIS — Z1211 Encounter for screening for malignant neoplasm of colon: Secondary | ICD-10-CM | POA: Diagnosis present

## 2024-04-18 ENCOUNTER — Other Ambulatory Visit: Payer: Self-pay | Admitting: Sports Medicine

## 2024-04-18 DIAGNOSIS — M47816 Spondylosis without myelopathy or radiculopathy, lumbar region: Secondary | ICD-10-CM

## 2024-04-18 DIAGNOSIS — G8929 Other chronic pain: Secondary | ICD-10-CM

## 2024-04-18 DIAGNOSIS — M4316 Spondylolisthesis, lumbar region: Secondary | ICD-10-CM

## 2024-04-18 DIAGNOSIS — M5416 Radiculopathy, lumbar region: Secondary | ICD-10-CM

## 2024-04-18 DIAGNOSIS — M51369 Other intervertebral disc degeneration, lumbar region without mention of lumbar back pain or lower extremity pain: Secondary | ICD-10-CM
